# Patient Record
Sex: Female | Born: 1950 | Race: White | Hispanic: No | Smoking: Former smoker
Health system: Southern US, Community
[De-identification: ages and names within clinical notes are randomized; demographics above are authoritative.]

## PROBLEM LIST (undated history)

## (undated) DIAGNOSIS — E039 Hypothyroidism, unspecified: Secondary | ICD-10-CM

## (undated) DIAGNOSIS — K269 Duodenal ulcer, unspecified as acute or chronic, without hemorrhage or perforation: Secondary | ICD-10-CM

## (undated) DIAGNOSIS — F419 Anxiety disorder, unspecified: Secondary | ICD-10-CM

## (undated) DIAGNOSIS — E669 Obesity, unspecified: Secondary | ICD-10-CM

## (undated) DIAGNOSIS — K602 Anal fissure, unspecified: Secondary | ICD-10-CM

## (undated) DIAGNOSIS — K589 Irritable bowel syndrome without diarrhea: Secondary | ICD-10-CM

## (undated) DIAGNOSIS — I10 Essential (primary) hypertension: Secondary | ICD-10-CM

## (undated) DIAGNOSIS — C50919 Malignant neoplasm of unspecified site of unspecified female breast: Secondary | ICD-10-CM

## (undated) HISTORY — DX: Obesity, unspecified: E66.9

## (undated) HISTORY — PX: BLADDER SURGERY: SHX569

## (undated) HISTORY — PX: MASTECTOMY: SHX3

## (undated) HISTORY — DX: Anxiety disorder, unspecified: F41.9

## (undated) HISTORY — PX: CARPAL TUNNEL RELEASE: SHX101

---

## 1999-12-04 ENCOUNTER — Emergency Department (HOSPITAL_COMMUNITY): Admission: EM | Admit: 1999-12-04 | Discharge: 1999-12-04 | Payer: Self-pay | Admitting: Emergency Medicine

## 1999-12-04 ENCOUNTER — Encounter: Payer: Self-pay | Admitting: Emergency Medicine

## 2001-08-14 ENCOUNTER — Encounter: Payer: Self-pay | Admitting: Emergency Medicine

## 2001-08-14 ENCOUNTER — Emergency Department (HOSPITAL_COMMUNITY): Admission: EM | Admit: 2001-08-14 | Discharge: 2001-08-14 | Payer: Self-pay | Admitting: Emergency Medicine

## 2002-10-19 ENCOUNTER — Encounter: Admission: RE | Admit: 2002-10-19 | Discharge: 2002-10-19 | Payer: Self-pay | Admitting: Internal Medicine

## 2002-10-19 ENCOUNTER — Encounter: Payer: Self-pay | Admitting: Internal Medicine

## 2003-02-15 ENCOUNTER — Other Ambulatory Visit: Admission: RE | Admit: 2003-02-15 | Discharge: 2003-02-15 | Payer: Self-pay | Admitting: Obstetrics and Gynecology

## 2005-04-26 ENCOUNTER — Encounter: Admission: RE | Admit: 2005-04-26 | Discharge: 2005-04-26 | Payer: Self-pay

## 2006-03-28 ENCOUNTER — Other Ambulatory Visit: Admission: RE | Admit: 2006-03-28 | Discharge: 2006-03-28 | Payer: Self-pay | Admitting: Obstetrics and Gynecology

## 2006-03-30 ENCOUNTER — Emergency Department (HOSPITAL_COMMUNITY): Admission: EM | Admit: 2006-03-30 | Discharge: 2006-03-30 | Payer: Self-pay | Admitting: Emergency Medicine

## 2007-04-03 ENCOUNTER — Emergency Department (HOSPITAL_COMMUNITY): Admission: EM | Admit: 2007-04-03 | Discharge: 2007-04-03 | Payer: Self-pay | Admitting: Emergency Medicine

## 2007-08-28 ENCOUNTER — Emergency Department (HOSPITAL_COMMUNITY): Admission: EM | Admit: 2007-08-28 | Discharge: 2007-08-28 | Payer: Self-pay | Admitting: Emergency Medicine

## 2010-02-20 LAB — HM COLONOSCOPY

## 2011-05-09 LAB — VALPROIC ACID LEVEL: Valproic Acid Lvl: 55.3

## 2012-02-08 ENCOUNTER — Emergency Department (HOSPITAL_COMMUNITY): Payer: 59

## 2012-02-08 ENCOUNTER — Observation Stay (HOSPITAL_COMMUNITY)
Admission: EM | Admit: 2012-02-08 | Discharge: 2012-02-11 | DRG: 313 | Disposition: A | Discharge status: Home / Self Care | Payer: 59 | Attending: Internal Medicine | Admitting: Internal Medicine

## 2012-02-08 ENCOUNTER — Encounter (HOSPITAL_COMMUNITY): Payer: Self-pay | Admitting: Emergency Medicine

## 2012-02-08 DIAGNOSIS — R079 Chest pain, unspecified: Secondary | ICD-10-CM

## 2012-02-08 DIAGNOSIS — K589 Irritable bowel syndrome without diarrhea: Secondary | ICD-10-CM | POA: Diagnosis present

## 2012-02-08 DIAGNOSIS — Z8711 Personal history of peptic ulcer disease: Secondary | ICD-10-CM

## 2012-02-08 DIAGNOSIS — Z882 Allergy status to sulfonamides status: Secondary | ICD-10-CM

## 2012-02-08 DIAGNOSIS — R0789 Other chest pain: Principal | ICD-10-CM | POA: Diagnosis present

## 2012-02-08 DIAGNOSIS — K219 Gastro-esophageal reflux disease without esophagitis: Secondary | ICD-10-CM | POA: Diagnosis present

## 2012-02-08 DIAGNOSIS — Z885 Allergy status to narcotic agent status: Secondary | ICD-10-CM

## 2012-02-08 DIAGNOSIS — Z8249 Family history of ischemic heart disease and other diseases of the circulatory system: Secondary | ICD-10-CM

## 2012-02-08 DIAGNOSIS — R1013 Epigastric pain: Secondary | ICD-10-CM

## 2012-02-08 DIAGNOSIS — Z9010 Acquired absence of unspecified breast and nipple: Secondary | ICD-10-CM

## 2012-02-08 DIAGNOSIS — R0602 Shortness of breath: Secondary | ICD-10-CM

## 2012-02-08 DIAGNOSIS — Z87891 Personal history of nicotine dependence: Secondary | ICD-10-CM

## 2012-02-08 DIAGNOSIS — Z853 Personal history of malignant neoplasm of breast: Secondary | ICD-10-CM

## 2012-02-08 DIAGNOSIS — E039 Hypothyroidism, unspecified: Secondary | ICD-10-CM

## 2012-02-08 DIAGNOSIS — K279 Peptic ulcer, site unspecified, unspecified as acute or chronic, without hemorrhage or perforation: Secondary | ICD-10-CM | POA: Diagnosis present

## 2012-02-08 DIAGNOSIS — I10 Essential (primary) hypertension: Secondary | ICD-10-CM

## 2012-02-08 HISTORY — DX: Duodenal ulcer, unspecified as acute or chronic, without hemorrhage or perforation: K26.9

## 2012-02-08 HISTORY — DX: Irritable bowel syndrome, unspecified: K58.9

## 2012-02-08 HISTORY — DX: Essential (primary) hypertension: I10

## 2012-02-08 HISTORY — DX: Hypothyroidism, unspecified: E03.9

## 2012-02-08 HISTORY — DX: Anal fissure, unspecified: K60.2

## 2012-02-08 HISTORY — DX: Malignant neoplasm of unspecified site of unspecified female breast: C50.919

## 2012-02-08 LAB — CARDIAC PANEL(CRET KIN+CKTOT+MB+TROPI)
Relative Index: INVALID (ref 0.0–2.5)
Troponin I: <0.3 ng/mL (ref <0.30)

## 2012-02-08 LAB — URINALYSIS, ROUTINE W REFLEX MICROSCOPIC
Bilirubin Urine: NEGATIVE
Glucose, UA: NEGATIVE mg/dL
Hgb urine dipstick: NEGATIVE
Ketones, ur: NEGATIVE mg/dL
Leukocytes, UA: NEGATIVE
Nitrite: NEGATIVE
Protein, ur: NEGATIVE mg/dL
Specific Gravity, Urine: 1.019 (ref 1.005–1.030)
Urobilinogen, UA: 0.2 mg/dL (ref 0.0–1.0)
pH: 5 (ref 5.0–8.0)

## 2012-02-08 LAB — COMPREHENSIVE METABOLIC PANEL
ALT: 18 U/L (ref 0–35)
AST: 16 U/L (ref 0–37)
Albumin: 3.5 g/dL (ref 3.5–5.2)
Alkaline Phosphatase: 82 U/L (ref 39–117)
BUN: 12 mg/dL (ref 6–23)
CO2: 23 mEq/L (ref 19–32)
Calcium: 9.4 mg/dL (ref 8.4–10.5)
Chloride: 101 mEq/L (ref 96–112)
Creatinine, Ser: 0.64 mg/dL (ref 0.50–1.10)
GFR calc Af Amer: >90 mL/min (ref >90)
GFR calc non Af Amer: >90 mL/min (ref >90)
Glucose, Bld: 90 mg/dL (ref 70–99)
Potassium: 4.3 mEq/L (ref 3.5–5.1)
Sodium: 137 mEq/L (ref 135–145)
Total Bilirubin: 0.4 mg/dL (ref 0.3–1.2)
Total Protein: 7.6 g/dL (ref 6.0–8.3)

## 2012-02-08 LAB — CBC
HCT: 38.1 % (ref 36.0–46.0)
Hemoglobin: 12.8 g/dL (ref 12.0–15.0)
MCH: 29.6 pg (ref 26.0–34.0)
MCHC: 33.6 g/dL (ref 30.0–36.0)
MCV: 88.2 fL (ref 78.0–100.0)
Platelets: 274 10*3/uL (ref 150–400)
RBC: 4.32 MIL/uL (ref 3.87–5.11)
RDW: 13.7 % (ref 11.5–15.5)
WBC: 6.8 10*3/uL (ref 4.0–10.5)

## 2012-02-08 LAB — LIPID PANEL
Cholesterol: 181 mg/dL (ref 0–200)
HDL: 57 mg/dL (ref >39)
Total CHOL/HDL Ratio: 3.2 RATIO

## 2012-02-08 LAB — TROPONIN I: Troponin I: <0.3 ng/mL (ref <0.30)

## 2012-02-08 LAB — DIFFERENTIAL
Basophils Absolute: 0 10*3/uL (ref 0.0–0.1)
Basophils Relative: 0 % (ref 0–1)
Eosinophils Absolute: 0 10*3/uL (ref 0.0–0.7)
Eosinophils Relative: 1 % (ref 0–5)
Lymphocytes Relative: 24 % (ref 12–46)
Lymphs Abs: 1.6 10*3/uL (ref 0.7–4.0)
Monocytes Absolute: 0.4 10*3/uL (ref 0.1–1.0)
Monocytes Relative: 5 % (ref 3–12)
Neutro Abs: 4.8 10*3/uL (ref 1.7–7.7)
Neutrophils Relative %: 70 % (ref 43–77)

## 2012-02-08 LAB — LIPASE, BLOOD: Lipase: 25 U/L (ref 11–59)

## 2012-02-08 MED ORDER — LEVOTHYROXINE SODIUM 100 MCG PO TABS
100.0000 ug | ORAL_TABLET | Freq: Every day | ORAL | Status: DC
  Administered 2012-02-08 – 2012-02-11 (×4): 100 ug via ORAL
  Filled 2012-02-08 (×6): qty 1

## 2012-02-08 MED ORDER — ACETAMINOPHEN 650 MG RE SUPP: 650.0000 mg | Freq: Four times a day (QID) | RECTAL | Status: DC | PRN

## 2012-02-08 MED ORDER — ONDANSETRON HCL 4 MG/2ML IJ SOLN: 4.0000 mg | Freq: Four times a day (QID) | INTRAMUSCULAR | Status: DC | PRN

## 2012-02-08 MED ORDER — NITROGLYCERIN 0.4 MG SL SUBL: 0.4000 mg | SUBLINGUAL_TABLET | SUBLINGUAL | Status: DC | PRN

## 2012-02-08 MED ORDER — SODIUM CHLORIDE 0.9 % IV SOLN
INTRAVENOUS | Status: DC
  Administered 2012-02-08: 19:00:00 via INTRAVENOUS

## 2012-02-08 MED ORDER — ACETAMINOPHEN 325 MG PO TABS: 650.0000 mg | ORAL_TABLET | Freq: Four times a day (QID) | ORAL | Status: DC | PRN

## 2012-02-08 MED ORDER — ASPIRIN 81 MG PO CHEW
324.0000 mg | CHEWABLE_TABLET | Freq: Once | ORAL | Status: AC
  Administered 2012-02-08: 324 mg via ORAL
  Filled 2012-02-08: qty 4

## 2012-02-08 MED ORDER — ENOXAPARIN SODIUM 40 MG/0.4ML ~~LOC~~ SOLN
40.0000 mg | SUBCUTANEOUS | Status: DC
  Administered 2012-02-08 – 2012-02-10 (×3): 40 mg via SUBCUTANEOUS
  Filled 2012-02-08 (×4): qty 0.4

## 2012-02-08 MED ORDER — MORPHINE SULFATE 2 MG/ML IJ SOLN: 1.0000 mg | INTRAMUSCULAR | Status: DC | PRN

## 2012-02-08 MED ORDER — ALUM & MAG HYDROXIDE-SIMETH 200-200-20 MG/5ML PO SUSP: 30.0000 mL | Freq: Four times a day (QID) | ORAL | Status: DC | PRN

## 2012-02-08 MED ORDER — METOPROLOL SUCCINATE ER 50 MG PO TB24
50.0000 mg | ORAL_TABLET | Freq: Every day | ORAL | Status: DC
  Administered 2012-02-08 – 2012-02-11 (×4): 50 mg via ORAL
  Filled 2012-02-08 (×4): qty 1

## 2012-02-08 MED ORDER — BENAZEPRIL HCL 20 MG PO TABS
30.0000 mg | ORAL_TABLET | Freq: Every day | ORAL | Status: DC
  Administered 2012-02-08 – 2012-02-11 (×4): 30 mg via ORAL
  Filled 2012-02-08 (×4): qty 1

## 2012-02-08 MED ORDER — ONDANSETRON HCL 4 MG PO TABS: 4.0000 mg | ORAL_TABLET | Freq: Four times a day (QID) | ORAL | Status: DC | PRN

## 2012-02-08 MED ORDER — ATORVASTATIN CALCIUM 80 MG PO TABS
80.0000 mg | ORAL_TABLET | Freq: Every day | ORAL | Status: DC
  Administered 2012-02-08: 80 mg via ORAL
  Filled 2012-02-08 (×2): qty 1

## 2012-02-08 MED ORDER — GI COCKTAIL ~~LOC~~
30.0000 mL | Freq: Once | ORAL | Status: DC
  Filled 2012-02-08: qty 30

## 2012-02-08 MED ORDER — SODIUM CHLORIDE 0.9 % IJ SOLN
3.0000 mL | Freq: Two times a day (BID) | INTRAMUSCULAR | Status: DC
  Administered 2012-02-09 – 2012-02-10 (×2): 3 mL via INTRAVENOUS

## 2012-02-08 NOTE — ED Notes (Signed)
Patient transported to X-ray 

## 2012-02-08 NOTE — ED Notes (Signed)
Admitting at bedside 

## 2012-02-08 NOTE — ED Provider Notes (Signed)
History     CSN: [id number]  Arrival date & time 02/08/12  [id number]   First MD Initiated Contact with Patient 02/08/12 1029      Chief Complaint  Patient presents with  . Chest Pain    shortness of breath    (Consider location/radiation/quality/duration/timing/severity/associated sxs/prior treatment) HPI Patient resents emergency department with chest pain that has been intermittent over the past week.  Patient, states she has also had shortness of breath at rest and with exertion.  Patient, states she also had some upper abdominal pain, associated with the chest pain.  Patient denies nausea, vomiting, diarrhea, fever, weakness, numbness, dizziness, syncope, visual changes, or cough.  Patient, states she did not try any medications prior to arrival.  She was sent here by an urgent care center.  She normally seeks her medical care from them.  Past Medical History  Diagnosis Date  . Hypertension   . Hypothyroid     History reviewed. No pertinent past surgical history.  No family history on file.  History  Substance Use Topics  . Smoking status: Former Games developer  . Smokeless tobacco: Not on file  . Alcohol Use: 0.6 oz/week    1 Shots of liquor per week    OB History    Grav Para Term Preterm Abortions TAB SAB Ect Mult Living                  Review of Systems All other systems negative except as documented in the HPI. All pertinent positives and negatives as reviewed in the HPI.  Allergies  Codeine and Sulfa antibiotics  Home Medications   Current Outpatient Rx  Name Route Sig Dispense Refill  . BENAZEPRIL HCL 10 MG PO TABS Oral Take 10 mg by mouth daily. Take along with 20mg  tablet to equal 30mg  dose.    Marland Kitchen BENAZEPRIL HCL 20 MG PO TABS Oral Take 20 mg by mouth daily. Take along with 10mg  tablet to equal 30mg  dose.    . CO Q 10 PO Oral Take 2 tablets by mouth daily.    Marland Kitchen LEVOTHYROXINE SODIUM 100 MCG PO TABS Oral Take 100 mcg by mouth daily.    Marland Kitchen METOPROLOL SUCCINATE ER 50  MG PO TB24 Oral Take 50 mg by mouth daily. Take with or immediately following a meal.      BP 107/59  Pulse 72  Temp 98.1 F (36.7 C)  Resp 14  Ht 5' 1.5" (1.562 m)  Wt 182 lb (82.555 kg)  BMI 33.83 kg/m2  SpO2 97%  Physical Exam  Constitutional: She is oriented to person, place, and time. She appears well-developed and well-nourished. No distress.  HENT:  Head: Normocephalic and atraumatic.  Mouth/Throat: Oropharynx is clear and moist.  Eyes: Pupils are equal, round, and reactive to light.  Neck: Normal range of motion. Neck supple. No thyromegaly present.  Cardiovascular: Normal rate and regular rhythm.  Exam reveals no gallop and no friction rub.   No murmur heard. Pulmonary/Chest: Effort normal and breath sounds normal. No respiratory distress.  Abdominal: Soft. Bowel sounds are normal. There is tenderness. There is no rebound and no guarding.  Neurological: She is alert and oriented to person, place, and time.  Skin: Skin is warm and dry. No rash noted.    ED Course  Procedures (including critical care time)   Labs Reviewed  CBC  DIFFERENTIAL  COMPREHENSIVE METABOLIC PANEL  LIPASE, BLOOD  TROPONIN I  URINALYSIS, ROUTINE W REFLEX MICROSCOPIC   Dg Chest 2  View  02/08/2012  *RADIOLOGY REPORT*  Clinical Data: Chest pain.  Shortness of breath.  CHEST - 2 VIEW  Comparison: 04/03/2007 thoracic spine radiographs.  Findings: The cardiomediastinal silhouette is unremarkable. Mild peribronchial thickening is stable. Left mastectomy and axillary surgical changes noted. There is no evidence of focal airspace disease, pulmonary edema, suspicious pulmonary nodule/mass, pleural effusion, or pneumothorax. No acute bony abnormalities are identified.  IMPRESSION: No evidence of acute cardiopulmonary disease.  Original Report Authenticated By: Rosendo Gros, M.D.     1. Chest pain    The patient will be admitted to the family practice residents.  They will be down to see the patient  for evaluation of her chest pain.  Patient is advised of the results and the plan.  All questions were answered.   MDM  MDM Reviewed: nursing note and vitals Interpretation: labs, ECG and x-ray Consults: admitting MD            Carlyle Dolly, PA-C 02/08/12 1513

## 2012-02-08 NOTE — ED Provider Notes (Signed)
10:48 AM  Date: 02/08/2012  Rate: 76  Rhythm: normal sinus rhythm  QRS Axis: normal  Intervals: normal  ST/T Wave abnormalities: normal  Conduction Disutrbances:none  Narrative Interpretation: Normal EKG.  Old EKG Reviewed: none available    Carleene Cooper III, MD 02/08/12 1048

## 2012-02-08 NOTE — ED Notes (Signed)
Pt. Stated, I've had some sharp pain in the lower central area of my chest that radiated to my back  Onset for the last couple of days.

## 2012-02-08 NOTE — H&P (Signed)
Hospital Admission Note Date: 02/08/2012  Patient name: Kelsey Moore Medical record number: [id number] Date of birth: [birth year] Age: 61 y.o. Gender: female PCP: Charolett Bumpers, MD ([facility])  Medical Service: Internal Medicine Teaching Service   Attending physician:  Margarito Liner MD    1st Contact: Dr Manson Passey    Pager: [phone] 2nd Contact: Dr Loistine Chance    Pager: [phone]  After 5 pm or weekends: 1st Contact:      Pager: [phone] 2nd Contact:      Pager: [phone]  Chief Complaint: Chest pain  History of Present Illness: The patient is a 61 year old woman with PMH significant for Breast Cancer, Hypertension, prior PUD, and Hypothyroidism who presented to the Urgent Care with chest pain, sent to the ED for further evaluation and management.  The patient notes a 3-day history of chest pain, described as a mid-chest dull aching pain, 5/10 in severity, occurring several of times per day, lasting <5 minutes per episode, resolving without intervention, sometimes occurring at rest and sometimes with exertion.  The pain does note radiate, but the patient also notes an occasional left arm and left jaw feeling of "tightness", which also comes and goes, but not at the same time as the chest pain.  Chest pain is associated with mild SOB and nausea, but no vomiting.  She has a FH of a father with 2 MI's in his 19's, a prior history of smoking 24 pack-years, and HTN, but no known HL or DM.  Of note, the patient notes a stress test 2-3 years ago in [place] which she reports was normal.  The patient also notes a 2-week history of SOB, which comes and goes, which can occur both at rest or with exertion.  The SOB does not necessarily always correlate with her chest pain.  She notes a >1 year history of 3-pillow orthopnea, but no PND, no leg swelling, no history of CHF.  No fevers, chills, cough, ST, congestion.  The patient also notes a 2-3 day history of a constant sharp epigastric pain, 6/10 in  severity, radiating to her back.  The pain is unchanged by eating food, but appears to worsen 30 minutes after eating.  She notes no nausea or vomiting.  She notes chronic IBS, but recent stools have been soft (no diarrhea/constipation).  She notes occasional blood in her stools, present for many months, which she attributes to her known rectal fissures.  Meds: Benazepril 20 mg daily Toprol-XL 24-hr tab, 50 mg Synthroid 100 mcg daily Coenzyme Q10, 2 tablets daily  Allergies: Allergies as of 02/08/2012 - Review Complete 02/08/2012  Allergen Reaction Noted  . Codeine Nausea Only 02/08/2012  . Sulfa antibiotics Rash 02/08/2012   Past Medical History  Diagnosis Date  . Hypertension   . Hypothyroid     multinodular goiter by [place] 2004  . Duodenal ulcer     At age 16s  . Breast cancer     s/p Left mastectomy 1998, s/p R radical mastectomy 1997, lymph nodes negative, in remission  . IBS (irritable bowel syndrome)   . Anal fissure    Past Surgical History  Procedure Date  . Mastectomy 1997, 1998    Left mastectomy 1998, Right radical mastectomy 1997  . Carpal tunnel release   . Bladder surgery     vaginal approach (not abdominal approach)   Family History  Problem Relation Age of Onset  . Heart disease Mother 2    MI   . Hypertension Mother   .  Hypertension Father   . Heart disease Father 2    MI  . Cancer Mother     Breast cancer     History   Social History  . Marital Status: Married    Spouse Name: N/A    Number of Children: 0  . Years of Education: [employer] Sch[employer]   Occupational History  . 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 Smoking status: Former Smoker -- 1.0 packs/day for 24 years    Quit date: 08/19/1990  . Smokeless tobacco: Not on file  . Alcohol Use: 0.6 oz/week    1 Shots of liquor per week  . Drug Use: No  . Sexually Active: Not on file   Other Topics Concern  . Not on file   Social History  Narrative   Works as a Research officer, political party.  Lots of stress in her job.  She took a few college courses but did not complete college.  Her husband died in 2008/01/25, and she now lives alone.  [employer].  Literate.     Review of Systems: Positive if bold.  Constitutional: fever, chills, diaphoresis, appetite change and fatigue.  HEENT:  photophobia, eye pain, redness, hearing loss, ear pain, congestion, sore throat, rhinorrhea, sneezing, mouth sores, trouble swallowing, neck pain, neck stiffness and tinnitus.   Respiratory: SOB, DOE, cough, chest tightness,  and wheezing.   Cardiovascular:  chest pain, palpitations and leg swelling.  Gastrointestinal:  nausea, vomiting, abdominal pain, diarrhea, constipation, blood in stool and abdominal distention.  Genitourinary: dysuria, urgency, frequency, hematuria, flank pain and difficulty urinating.  Musculoskeletal:  myalgias, back pain, joint swelling, arthralgias and gait problem.  Skin:  pallor, rash and wound.  Neurological:  dizziness, seizures, syncope, weakness, light-headedness, numbness and headaches.  Hematological:  adenopathy. Easy bruising, personal or family bleeding history  Psychiatric/Behavioral:  suicidal ideation, mood changes, confusion, nervousness, sleep disturbance and agitation    Physical Exam: Blood pressure 152/72, pulse 86, temperature 98.1 F (36.7 C), resp. rate 14, height 5' 1.5" (1.562 m), weight 182 lb (82.555 kg), SpO2 98.00%. General: alert, cooperative, and in no apparent distress HEENT: pupils equal round and reactive to light, vision grossly intact, oropharynx clear and non-erythematous  Neck: supple, no lymphadenopathy Lungs: clear to ascultation bilaterally, normal work of respiration, no wheezes, rales, ronchi Heart: regular rate and rhythm, no murmurs, gallops, or rubs Abdomen: soft, significantly tender to epigastric palpation, non-distended, +bs, no guarding or rebound tenderness,  negative murphy's sign Extremities: no cyanosis, clubbing, or edema.  No LE edema, hyperpigmentation, or tenderness Neurologic: alert & oriented X3, cranial nerves II-XII intact, strength grossly intact, sensation intact to light touch  Lab results: Basic Metabolic Panel:  Basename 02/08/12 1045  NA 137  K 4.3  CL 101  CO2 23  GLUCOSE 90  BUN 12  CREATININE 0.64  CALCIUM 9.4  MG --  PHOS --   Liver Function Tests:  Basename 02/08/12 1045  AST 16  ALT 18  ALKPHOS 82  BILITOT 0.4  PROT 7.6  ALBUMIN 3.5    Basename 02/08/12 1045  LIPASE 25  AMYLASE --    CBC:  Basename 02/08/12 1045  WBC 6.8  NEUTROABS 4.8  HGB 12.8  HCT 38.1  MCV 88.2  PLT 274   Cardiac Enzymes:  Basename 02/08/12 1045  CKTOTAL --  CKMB --  CKMBINDEX --  TROPONINI <0.30   Urinalysis:  Basename 02/08/12 1130  COLORURINE YELLOW  LABSPEC 1.019  PHURINE 5.0  GLUCOSEU NEGATIVE  HGBUR NEGATIVE  BILIRUBINUR NEGATIVE  KETONESUR NEGATIVE  PROTEINUR NEGATIVE  UROBILINOGEN 0.2  NITRITE NEGATIVE  LEUKOCYTESUR NEGATIVE    Imaging results:  Dg Chest 2 View  02/08/2012  *RADIOLOGY REPORT*  Clinical Data: Chest pain.  Shortness of breath.  CHEST - 2 VIEW  Comparison: 04/03/2007 thoracic spine radiographs.  Findings: The cardiomediastinal silhouette is unremarkable. Mild peribronchial thickening is stable. Left mastectomy and axillary surgical changes noted. There is no evidence of focal airspace disease, pulmonary edema, suspicious pulmonary nodule/mass, pleural effusion, or pneumothorax. No acute bony abnormalities are identified.  IMPRESSION: No evidence of acute cardiopulmonary disease.  Original Report Authenticated By: Rosendo Gros, M.D.    Other results: EKG: NSR, no ST abnormalities  Assessment & Plan by Problem: The patient is a 61 yo woman, history of HTN, prior smoking, FH of CAD, presenting with chest pain and epigastric pain.  # Chest Pain/SOB - The patient notes a 3-day  history of occasional, non-exertional, dull chest pain.  Given her age and risk factors, I'm concerned about ACS (most likely UA).  Other potential diagnoses include GI (PUD, cholecystitis) vs msk vs CHF exacerbation.  Unlikely PE given Wells Score of 0 and non-pleuritic nature of chest pain.  No EKG evidence to suggest pericarditis.  No CXR evidence to suggest PNA vs pneumothorax. -will try to obtain records of prior cardiac stress test -Cardiac enzymes x3, pro-BNP, Hb A1C -cardiology consult -continue metoprolol, benazepril -s/p aspirin -O2 by [place] -lipitor 80 -GI cocktail, Maalox prn, protonix -abd [place] -morphine prn, zofran prn  # Epigastric pain - See above, differential includes GERD vs PUD vs cholelithiasis vs cholecystitis.  Unlikely pancreatitis (lipase not elevated) vs PBC (no elevated LFT's) vs SBO (passing stool) vs UTI/pyelo (UA negative). -morphine for pain -abd [place] -GI cocktail, maalox prn, protonix  # Hypothyroidism - chronic, stable -continue synthroid  # Hypertension - chronic, stable -continue metoprolol, benazepril  # Prophy - lovenox  SignedJanalyn Harder 02/08/2012, 5:24 PM

## 2012-02-08 NOTE — ED Notes (Signed)
MD at bedside. 

## 2012-02-08 NOTE — ED Provider Notes (Signed)
Medical screening examination/treatment/procedure(s) were conducted as a shared visit with non-physician practitioner(s) and myself.  I personally evaluated the patient during the encounter 61 yo woman with intermittent chest pain for the past week, also some upper abdominal pain.  EKG non acute and enzymes negative, but story suggests CAD.  Advised admission.   Carleene Cooper III, MD 02/08/12 [phone]

## 2012-02-09 ENCOUNTER — Inpatient Hospital Stay (HOSPITAL_COMMUNITY): Payer: 59

## 2012-02-09 DIAGNOSIS — R079 Chest pain, unspecified: Secondary | ICD-10-CM

## 2012-02-09 DIAGNOSIS — R072 Precordial pain: Secondary | ICD-10-CM

## 2012-02-09 DIAGNOSIS — R0602 Shortness of breath: Secondary | ICD-10-CM | POA: Diagnosis present

## 2012-02-09 LAB — CBC
Hemoglobin: 10.7 g/dL — ABNORMAL LOW (ref 12.0–15.0)
MCH: 29.6 pg (ref 26.0–34.0)
MCHC: 33.1 g/dL (ref 30.0–36.0)
Platelets: 235 10*3/uL (ref 150–400)

## 2012-02-09 LAB — CARDIAC PANEL(CRET KIN+CKTOT+MB+TROPI)
CK, MB: 1.1 ng/mL (ref 0.3–4.0)
Total CK: 55 U/L (ref 7–177)
Troponin I: <0.3 ng/mL (ref <0.30)

## 2012-02-09 LAB — HEMOGLOBIN A1C: Mean Plasma Glucose: 120 mg/dL — ABNORMAL HIGH (ref <117)

## 2012-02-09 LAB — BASIC METABOLIC PANEL
BUN: 11 mg/dL (ref 6–23)
Calcium: 8.3 mg/dL — ABNORMAL LOW (ref 8.4–10.5)
GFR calc non Af Amer: >90 mL/min (ref >90)
Glucose, Bld: 94 mg/dL (ref 70–99)

## 2012-02-09 MED ORDER — ASPIRIN 325 MG PO TABS
325.0000 mg | ORAL_TABLET | Freq: Every day | ORAL | Status: DC
  Administered 2012-02-10 – 2012-02-11 (×2): 325 mg via ORAL
  Filled 2012-02-09 (×3): qty 1

## 2012-02-09 NOTE — Consult Note (Signed)
CARDIOLOGY CONSULT NOTE    Patient ID: Kelsey Moore MRN: [id number] DOB/AGE: 1951-02-05 61 y.o.  Admit date: 02/08/2012 Referring Physician:  Manson Passey Primary Physician: Charolett Bumpers, MD Primary Cardiologist:  New Reason for Consultation: Chest Pain  Principal Problem:  *Chest pain Active Problems:  Hypertension  Hypothyroidism  Epigastric pain   HPI:   The patient is a 61 year old woman with PMH significant for Breast Cancer, Hypertension, prior PUD, and Hypothyroidism who presented to the Urgent Care with chest pain yesterday, sent to the ED for further evaluation and management. The patient notes a 3-day history of chest pain, described as a mid-chest dull aching pain, 5/10 in severity, occurring several of times per day, lasting <5 minutes per episode, resolving without intervention, sometimes occurring at rest and sometimes with exertion. The pain does note radiate, but the patient also notes an occasional left arm and left jaw feeling of "tightness", which also comes and goes, but not at the same time as the chest pain. Chest pain is associated with mild SOB and nausea, but no vomiting. She has a FH of a father with 2 MI's in his 61's, a prior history of smoking 24 pack-years, and HTN, but no known HL or DM. Of note, the patient notes a stress test 2-3 years ago in [place] which she reports was normal.  The patient also notes a 2-week history of SOB, which comes and goes, which can occur both at rest or with exertion. The SOB does not necessarily always correlate with her chest pain. She notes a >1 year history of 3-pillow orthopnea, but no PND, no leg swelling, no history of CHF. No fevers, chills, cough, ST, congestion.  The patient also notes a 2-3 day history of a constant sharp epigastric pain, 6/10 in severity, radiating to her back. The pain is unchanged by eating food, but appears to worsen 30 minutes after eating. She notes no nausea or vomiting. She notes chronic IBS, but recent  stools have been soft (no diarrhea/constipation). She notes occasional blood in her stools, present for many months, which she attributes to her known rectal fissures.  Curently pain free.  He pain has multiple manifestations and are mostly atypical.  So far has R/O with no acute ECG chages.    @ROS @ All other systems reviewed and negative except as noted above  Past Medical History  Diagnosis Date  . Hypertension   . Hypothyroid     multinodular goiter by [place] 2004  . Duodenal ulcer     At age 4s  . Breast cancer     s/p Left mastectomy 1998, s/p R radical mastectomy 1997, lymph nodes negative, in remission  . IBS (irritable bowel syndrome)   . Anal fissure     Family History  Problem Relation Age of Onset  . Heart disease Mother 55    MI   . Hypertension Mother   . Hypertension Father   . Heart disease Father 13    MI  . Cancer Mother     Breast cancer     History   Social History  . Marital Status: Married    Spouse Name: N/A    Number of Children: 0  . Years of Education: [employer]   Occupational History  . 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 Smoking status: Former Smoker -- 1.0 packs/day for 24 years    Quit date: 08/19/1990  . Smokeless  tobacco: Not on file  . Alcohol Use: 0.6 oz/week    1 Shots of liquor per week  . Drug Use: No  . Sexually Active: Not on file   Other Topics Concern  . 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  She took a few college courses but did not complete college.  Her husband died in 01/21/2008, and she now lives alone.  [employer].  Literate.    Past Surgical History  Procedure Date  . Mastectomy 1997, 1998    Left mastectomy 1998, Right radical mastectomy 1997  . Carpal tunnel release   . Bladder surgery     vaginal approach (not abdominal approach)        . aspirin  324 mg Oral Once  .  atorvastatin  80 mg Oral q1800  . benazepril  30 mg Oral Daily  . enoxaparin  40 mg Subcutaneous Q24H  . gi cocktail  30 mL Oral Once  . levothyroxine  100 mcg Oral QAC breakfast  . metoprolol succinate  50 mg Oral Daily  . sodium chloride  3 mL Intravenous Q12H      . sodium chloride 75 mL/hr at 02/08/12 1838    Physical Exam: Blood pressure 123/70, pulse 78, temperature 98.5 F (36.9 C), temperature source Oral, resp. rate 18, height 5\' 1"  (1.549 m), weight 127.914 kg (282 lb), SpO2 98.00%.   Affect appropriate Obese talkative white female HEENT: normal Neck supple with no adenopathy JVP normal no bruits no thyromegaly Lungs clear with no wheezing and good diaphragmatic motion Heart:  S1/S2 no murmur, no rub, gallop or click PMI normal Abdomen: midl epigstric pain , BS positve, no tenderness, no AAA no bruit.  No HSM or HJR Distal pulses intact with no bruits No edema Neuro non-focal Skin warm and dry No muscular weakness   Labs:   Lab Results  Component Value Date   WBC 6.2 02/09/2012   HGB 10.7* 02/09/2012   HCT 32.3* 02/09/2012   MCV 89.5 02/09/2012   PLT 235 02/09/2012    Lab 02/09/12 0440 02/08/12 1045  NA 140 --  K 3.5 --  CL 109 --  CO2 22 --  BUN 11 --  CREATININE 0.56 --  CALCIUM 8.3* --  PROT -- 7.6  BILITOT -- 0.4  ALKPHOS -- 82  ALT -- 18  AST -- 16  GLUCOSE 94 --   Lab Results  Component Value Date   CKTOTAL 47 02/09/2012   CKMB 1.1 02/09/2012   TROPONINI <0.30 02/09/2012    Lab Results  Component Value Date   CHOL 181 02/08/2012   Lab Results  Component Value Date   HDL 57 02/08/2012   Lab Results  Component Value Date   LDLCALC 98 02/08/2012   Lab Results  Component Value Date   TRIG 131 02/08/2012   Lab Results  Component Value Date   CHOLHDL 3.2 02/08/2012   No results found for this basename: LDLDIRECT      Radiology: Dg Chest 2 View  02/08/2012  *RADIOLOGY REPORT*  Clinical Data: Chest pain.  Shortness of breath.  CHEST  - 2 VIEW  Comparison: 04/03/2007 thoracic spine radiographs.  Findings: The cardiomediastinal silhouette is unremarkable. Mild peribronchial thickening is stable. Left mastectomy and axillary surgical changes noted. There is no evidence of focal airspace disease, pulmonary edema, suspicious pulmonary nodule/mass, pleural effusion, or pneumothorax. No acute  bony abnormalities are identified.  IMPRESSION: No evidence of acute cardiopulmonary disease.  Original Report Authenticated By: Rosendo Gros, M.D.   US Abdomen Complete  02/09/2012  *RADIOLOGY REPORT*  Clinical Data:  61 year old female with abdominal pain.  ABDOMINAL ULTRASOUND COMPLETE  Comparison:  10/15/2007 renal ultrasound.  Findings:  Gallbladder:  The gallbladder is unremarkable. There is no evidence of gallstones, gallbladder wall thickening, or pericholecystic fluid.  Common Bile Duct:  There is no evidence of intrahepatic or extrahepatic biliary dilation. The CBD measures 4.0 mm in greatest diameter.  Liver:  Diffusely increased echogenicity of the liver is compatible with fatty infiltration.No focal abnormalities are identified.  IVC:  Appears normal.  Pancreas:  Although the pancreas is difficult to visualize in its entirety, no focal pancreatic abnormality is identified.  Spleen:  Within normal limits in size and echotexture.  Right kidney:  The right kidney is normal in size and parenchymal echogenicity.  There is no evidence of solid mass, hydronephrosis or definite renal calculi.  The right kidney measures 11.0 cm.  Left kidney:  The left kidney is normal in size and parenchymal echogenicity.  There is no evidence of solid mass, hydronephrosis or definite renal calculi.   The left kidney measures 11.0 cm.  Abdominal Aorta:  No abdominal aortic aneurysm identified.  There is no evidence of ascites.  IMPRESSION: Fatty infiltration of the liver, otherwise unremarkable abdominal ultrasound.  Original Report Authenticated By: Rosendo Gros, M.D.      EKG: 6/22 NSR rate 76 normal ECG   ASSESSMENT AND PLAN:  Chest Pain:  Mostly atypical pain.  Given female gender and body habitus favor stress myovue for risk stratification.  Discussed with patient and she is willing to proceed Can walk on treadmill well Chol:   Cholesterol is at goal.  Continue current dose of statin and diet Rx.  No myalgias or side effects.  F/U  LFT's in 6 months. Lab Results  Component Value Date   LDLCALC 98 02/08/2012   HTN: Well controlled.  Continue current medications and low sodium Dash type diet.   Dyspnea:  Echo pending CXR normal consider OSA w/u as outpatient Anemia: epigastric pain with anemia.  [place] normal guaic stools consider iron studies per primary service   Signed: Charlton Haws 02/09/2012, 10:46 AM

## 2012-02-09 NOTE — Progress Notes (Signed)
Subjective: No acute events overnight.  The patient continues to note occasional chest pain overnight.  Epigastric pain has improved somewhat.  Abd US showed no acute pathology.  Awaiting echocardiogram.  Given continued chest pain, will consult cardiology.  Objective: Vital signs in last 24 hours: Filed Vitals:   02/08/12 1721 02/08/12 2100 02/09/12 0500 02/09/12 1011  BP: 150/80 118/72 110/73 123/70  Pulse: 84 78 71 78  Temp: 98.4 F (36.9 C) 97.9 F (36.6 C) 98.5 F (36.9 C)   TempSrc: Oral     Resp: 20 20 18    Height: 5\' 1"  (1.549 m)     Weight: 282 lb (127.914 kg)     SpO2: 97% 96% 98%    Weight change:  No intake or output data in the 24 hours ending 02/09/12 1042  PEX General: alert, cooperative, and in no apparent distress HEENT: pupils equal round and reactive to light, vision grossly intact, oropharynx clear and non-erythematous  Neck: supple, no lymphadenopathy Lungs: clear to ascultation bilaterally, normal work of respiration, no wheezes, rales, ronchi Heart: regular rate and rhythm, no murmurs, gallops, or rubs Abdomen: soft, tender to epigastric palpation, non-distended, +bs, no guarding or rebound tenderness Extremities: no cyanosis, clubbing, or edema Neurologic: alert & oriented X3, cranial nerves II-XII intact, strength grossly intact, sensation intact to light touch  Lab Results: Basic Metabolic Panel:  Lab 02/09/12 1610 02/08/12 1629 02/08/12 1045  NA 140 -- 137  K 3.5 -- 4.3  CL 109 -- 101  CO2 22 -- 23  GLUCOSE 94 -- 90  BUN 11 -- 12  CREATININE 0.56 -- 0.64  CALCIUM 8.3* -- 9.4  MG -- 2.2 --  PHOS -- -- --   Liver Function Tests:  Lab 02/08/12 1045  AST 16  ALT 18  ALKPHOS 82  BILITOT 0.4  PROT 7.6  ALBUMIN 3.5    Lab 02/08/12 1045  LIPASE 25  AMYLASE --   CBC:  Lab 02/09/12 0440 02/08/12 1045  WBC 6.2 6.8  NEUTROABS -- 4.8  HGB 10.7* 12.8  HCT 32.3* 38.1  MCV 89.5 88.2  PLT 235 274   Cardiac Enzymes:  Lab 02/09/12 0440  02/08/12 2250 02/08/12 1631  CKTOTAL 47 55 50  CKMB 1.1 1.1 1.3  CKMBINDEX -- -- --  TROPONINI <0.30 <0.30 <0.30   BNP:  Lab 02/08/12 1631  PROBNP 47.4   Hemoglobin A1C:  Lab 02/08/12 1629  HGBA1C 5.8*   Fasting Lipid Panel:  Lab 02/08/12 1636  CHOL 181  HDL 57  LDLCALC 98  TRIG 131  CHOLHDL 3.2  LDLDIRECT --   Thyroid Function Tests:  Lab 02/08/12 1629  TSH 0.426  T4TOTAL --  FREET4 --  T3FREE --  THYROIDAB --   Urinalysis:  Lab 02/08/12 1130  COLORURINE YELLOW  LABSPEC 1.019  PHURINE 5.0  GLUCOSEU NEGATIVE  HGBUR NEGATIVE  BILIRUBINUR NEGATIVE  KETONESUR NEGATIVE  PROTEINUR NEGATIVE  UROBILINOGEN 0.2  NITRITE NEGATIVE  LEUKOCYTESUR NEGATIVE    Studies/Results: Dg Chest 2 View  02/08/2012  *RADIOLOGY REPORT*  Clinical Data: Chest pain.  Shortness of breath.  CHEST - 2 VIEW  Comparison: 04/03/2007 thoracic spine radiographs.  Findings: The cardiomediastinal silhouette is unremarkable. Mild peribronchial thickening is stable. Left mastectomy and axillary surgical changes noted. There is no evidence of focal airspace disease, pulmonary edema, suspicious pulmonary nodule/mass, pleural effusion, or pneumothorax. No acute bony abnormalities are identified.  IMPRESSION: No evidence of acute cardiopulmonary disease.  Original Report Authenticated By: JEFFREY T. HU,  M.D.   US Abdomen Complete  02/09/2012  *RADIOLOGY REPORT*  Clinical Data:  61 year old female with abdominal pain.  ABDOMINAL ULTRASOUND COMPLETE  Comparison:  10/15/2007 renal ultrasound.  Findings:  Gallbladder:  The gallbladder is unremarkable. There is no evidence of gallstones, gallbladder wall thickening, or pericholecystic fluid.  Common Bile Duct:  There is no evidence of intrahepatic or extrahepatic biliary dilation. The CBD measures 4.0 mm in greatest diameter.  Liver:  Diffusely increased echogenicity of the liver is compatible with fatty infiltration.No focal abnormalities are identified.  IVC:   Appears normal.  Pancreas:  Although the pancreas is difficult to visualize in its entirety, no focal pancreatic abnormality is identified.  Spleen:  Within normal limits in size and echotexture.  Right kidney:  The right kidney is normal in size and parenchymal echogenicity.  There is no evidence of solid mass, hydronephrosis or definite renal calculi.  The right kidney measures 11.0 cm.  Left kidney:  The left kidney is normal in size and parenchymal echogenicity.  There is no evidence of solid mass, hydronephrosis or definite renal calculi.   The left kidney measures 11.0 cm.  Abdominal Aorta:  No abdominal aortic aneurysm identified.  There is no evidence of ascites.  IMPRESSION: Fatty infiltration of the liver, otherwise unremarkable abdominal ultrasound.  Original Report Authenticated By: Rosendo Gros, M.D.   Medications: I have reviewed the patient's current medications. Scheduled Meds:   . aspirin  324 mg Oral Once  . atorvastatin  80 mg Oral q1800  . benazepril  30 mg Oral Daily  . enoxaparin  40 mg Subcutaneous Q24H  . gi cocktail  30 mL Oral Once  . levothyroxine  100 mcg Oral QAC breakfast  . metoprolol succinate  50 mg Oral Daily  . sodium chloride  3 mL Intravenous Q12H   Continuous Infusions:   . sodium chloride 75 mL/hr at 02/08/12 1838   PRN Meds:.acetaminophen, acetaminophen, alum & mag hydroxide-simeth, morphine injection, nitroGLYCERIN, ondansetron (ZOFRAN) IV, ondansetron  Assessment/Plan: The patient is a 61 yo woman, history of HTN, prior smoking, FH of CAD, presenting with chest pain and epigastric pain.   # Chest Pain/SOB - The patient's chest pain is atypical, but given risk factors of age, prior smoking, HTN, and FH, I remain concerned about UA.  Otherwise, most likely diagnosis is GI (GERD vs PUD) vs anxiety vs msk.  No lab/radiologic evidence of cholecystitis, PNA, pericarditis, pneumothorax, dissection. -We've requested records of prior cardiac stress test,  will place in shadow chart when received -cardiac enzymes neg x3, pro-BNP negative -consulted Dr. Eden Emms of Cardiology today, greatly appreciate assistance -patient may need inpt vs outpt stress test -continue metoprolol, benazepril, aspirin  -O2 by [place]  -patient declines lipitor out of fear of side effects.  Given patient preference, lipid profile, and negative CE x3, we can discontinue lipitor for now -GI cocktail, Maalox prn, protonix  -morphine prn, zofran prn   # Epigastric pain - Most likely GERD.  Also possibly PUD.  No evidence of cholecystitis, pancreatitis, PBC, SBO, UTI. -morphine for pain  -maalox prn, protonix   # Hypothyroidism - chronic, stable  -continue synthroid   # Hypertension - chronic, stable  -continue metoprolol, benazepril   # Prophy - lovenox    LOS: 1 day   Janalyn Harder 02/09/2012, 10:42 AM

## 2012-02-09 NOTE — Progress Notes (Signed)
  Echocardiogram 2D Echocardiogram has been performed.  [place] 02/09/2012, 5:51 PM

## 2012-02-09 NOTE — H&P (Signed)
44 woman admitted for recurrent spells of central chest pain, not consistently exertional.  EKG and troponins normal.  Cor tones normal.  Also has epigastric pain and mild tenderness.  Has had unusual stress at work lately.  Prior hx of ulcer, probably decades ago.  VS and all labs normal.  Has been seen by cardiology and stress test planned for tomorrow.  If unrevealing, may need EGD in future.

## 2012-02-10 ENCOUNTER — Inpatient Hospital Stay (HOSPITAL_COMMUNITY): Payer: 59

## 2012-02-10 DIAGNOSIS — R079 Chest pain, unspecified: Secondary | ICD-10-CM

## 2012-02-10 LAB — BASIC METABOLIC PANEL
BUN: 14 mg/dL (ref 6–23)
Chloride: 104 mEq/L (ref 96–112)
Creatinine, Ser: 0.75 mg/dL (ref 0.50–1.10)
GFR calc Af Amer: >90 mL/min (ref >90)
Glucose, Bld: 94 mg/dL (ref 70–99)

## 2012-02-10 LAB — CBC
HCT: 36.7 % (ref 36.0–46.0)
Hemoglobin: 11.8 g/dL — ABNORMAL LOW (ref 12.0–15.0)
MCV: 90.2 fL (ref 78.0–100.0)
RDW: 13.9 % (ref 11.5–15.5)
WBC: 6.7 10*3/uL (ref 4.0–10.5)

## 2012-02-10 NOTE — Progress Notes (Signed)
Internal Medicine Attending  Date: 02/10/2012  Patient name: Kelsey Moore Medical record number: [id number] Date of birth: 1951-03-05 Age: 61 y.o. Gender: female  I saw and evaluated the patient. I reviewed the resident's note by Dr. Manson Passey and I agree with the resident's findings and plans as documented in his note, with the following additional comments.  Patient reports that epigastric pain was her main problem when she came in to the hospital, and she also gives a history of chronic diarrhea attributed to irritable bowel syndrome in the past; she reports having had a colonoscopy about 2 years ago but reports having no EGD or upper GI workup.  She also reports a chronic problem with food (both solids and liquids) sometimes sticking and then taking a few seconds to move into her stomach.  Given this history, if her cardiac workup is negative, then empiric treatment with a PPI and an upper GI workup in the near future with EGD would be appropriate.

## 2012-02-10 NOTE — Progress Notes (Signed)
Subjective: Continued occasional chest/epigastric pain overnight, likely GI, unlikely cardiac in etiology.  Patient to go to cardiac stress test this morning.  If negative, likely discharge with outpatient ppi treatment.  Echo shows no wall motion abnormalities  Objective: Vital signs in last 24 hours: Filed Vitals:   02/10/12 0931 02/10/12 0933 02/10/12 0934 02/10/12 0936  BP: 242/62 172/62 175/63 154/64  Pulse: 93 88 88 83  Temp:      TempSrc:      Resp:      Height:      Weight:      SpO2:       Weight change:   Intake/Output Summary (Last 24 hours) at 02/10/12 1216 Last data filed at 02/09/12 1800  Gross per 24 hour  Intake    720 ml  Output      0 ml  Net    720 ml    PEX General: alert, cooperative, and in no apparent distress HEENT: pupils equal round and reactive to light, vision grossly intact, oropharynx clear and non-erythematous  Neck: supple, no lymphadenopathy Lungs: clear to ascultation bilaterally, normal work of respiration, no wheezes, rales, ronchi Heart: regular rate and rhythm, no murmurs, gallops, or rubs Abdomen: soft, tender to epigastric palpation, non-distended, +bs, no guarding or rebound tenderness Extremities: no cyanosis, clubbing, or edema Neurologic: alert & oriented X3, cranial nerves II-XII intact, strength grossly intact, sensation intact to light touch  Lab Results: Basic Metabolic Panel:  Lab 02/10/12 9147 02/09/12 0440 02/08/12 1629  NA 140 140 --  K 4.2 3.5 --  CL 104 109 --  CO2 27 22 --  GLUCOSE 94 94 --  BUN 14 11 --  CREATININE 0.75 0.56 --  CALCIUM 9.3 8.3* --  MG -- -- 2.2  PHOS -- -- --   Liver Function Tests:  Lab 02/08/12 1045  AST 16  ALT 18  ALKPHOS 82  BILITOT 0.4  PROT 7.6  ALBUMIN 3.5    Lab 02/08/12 1045  LIPASE 25  AMYLASE --   CBC:  Lab 02/10/12 0655 02/09/12 0440 02/08/12 1045  WBC 6.7 6.2 --  NEUTROABS -- -- 4.8  HGB 11.8* 10.7* --  HCT 36.7 32.3* --  MCV 90.2 89.5 --  PLT 258 235 --    Cardiac Enzymes:  Lab 02/09/12 0440 02/08/12 2250 02/08/12 1631  CKTOTAL 47 55 50  CKMB 1.1 1.1 1.3  CKMBINDEX -- -- --  TROPONINI <0.30 <0.30 <0.30   BNP:  Lab 02/08/12 1631  PROBNP 47.4   Hemoglobin A1C:  Lab 02/08/12 1629  HGBA1C 5.8*   Fasting Lipid Panel:  Lab 02/08/12 1636  CHOL 181  HDL 57  LDLCALC 98  TRIG 131  CHOLHDL 3.2  LDLDIRECT --   Thyroid Function Tests:  Lab 02/08/12 1629  TSH 0.426  T4TOTAL --  FREET4 --  T3FREE --  THYROIDAB --   Urinalysis:  Lab 02/08/12 1130  COLORURINE YELLOW  LABSPEC 1.019  PHURINE 5.0  GLUCOSEU NEGATIVE  HGBUR NEGATIVE  BILIRUBINUR NEGATIVE  KETONESUR NEGATIVE  PROTEINUR NEGATIVE  UROBILINOGEN 0.2  NITRITE NEGATIVE  LEUKOCYTESUR NEGATIVE    Studies/Results: US Abdomen Complete  02/09/2012  *RADIOLOGY REPORT*  Clinical Data:  61 year old female with abdominal pain.  ABDOMINAL ULTRASOUND COMPLETE  Comparison:  10/15/2007 renal ultrasound.  Findings:  Gallbladder:  The gallbladder is unremarkable. There is no evidence of gallstones, gallbladder wall thickening, or pericholecystic fluid.  Common Bile Duct:  There is no evidence of intrahepatic or extrahepatic biliary  dilation. The CBD measures 4.0 mm in greatest diameter.  Liver:  Diffusely increased echogenicity of the liver is compatible with fatty infiltration.No focal abnormalities are identified.  IVC:  Appears normal.  Pancreas:  Although the pancreas is difficult to visualize in its entirety, no focal pancreatic abnormality is identified.  Spleen:  Within normal limits in size and echotexture.  Right kidney:  The right kidney is normal in size and parenchymal echogenicity.  There is no evidence of solid mass, hydronephrosis or definite renal calculi.  The right kidney measures 11.0 cm.  Left kidney:  The left kidney is normal in size and parenchymal echogenicity.  There is no evidence of solid mass, hydronephrosis or definite renal calculi.   The left kidney  measures 11.0 cm.  Abdominal Aorta:  No abdominal aortic aneurysm identified.  There is no evidence of ascites.  IMPRESSION: Fatty infiltration of the liver, otherwise unremarkable abdominal ultrasound.  Original Report Authenticated By: Rosendo Gros, M.D.   Medications: I have reviewed the patient's current medications. Scheduled Meds:    . aspirin  325 mg Oral Daily  . benazepril  30 mg Oral Daily  . enoxaparin  40 mg Subcutaneous Q24H  . gi cocktail  30 mL Oral Once  . levothyroxine  100 mcg Oral QAC breakfast  . metoprolol succinate  50 mg Oral Daily  . sodium chloride  3 mL Intravenous Q12H   Continuous Infusions:  PRN Meds:.acetaminophen, acetaminophen, alum & mag hydroxide-simeth, morphine injection, nitroGLYCERIN, ondansetron (ZOFRAN) IV, ondansetron  Assessment/Plan: The patient is a 61 yo woman, history of HTN, prior smoking, FH of CAD, presenting with chest pain and epigastric pain.   # Chest Pain/SOB - The patient's chest pain is atypical, but given risk factors of age, prior smoking, HTN, and FH, concerned about UA.  Otherwise, most likely diagnosis is GI (GERD vs PUD) vs anxiety vs msk.  No lab/radiologic evidence of cholecystitis, PNA, pericarditis, pneumothorax, dissection. -cardiac enzymes neg x3, pro-BNP negative -consulted Dr. Eden Emms of Cardiology today, greatly appreciate assistance -outpt stress test today, results pending -echo shows no wall motion abnormalities -continue metoprolol, benazepril, aspirin  -O2 by [place]  -patient declines lipitor out of fear of side effects.  Given patient preference, lipid profile, and negative CE x3, we can discontinue lipitor for now -GI cocktail, Maalox prn, protonix  -morphine prn, zofran prn   # Epigastric pain - Most likely GERD.  Also possibly PUD.  No evidence of cholecystitis, pancreatitis, PBC, SBO, UTI. -morphine for pain  -maalox prn, protonix  -stool antigen for H Pylori, consider empiric treatment at discharge  #  Hypothyroidism - chronic, stable  -continue synthroid   # Hypertension - chronic, stable  -continue metoprolol, benazepril   # Prophy - lovenox    LOS: 2 days   Janalyn Harder 02/10/2012, 12:16 PM

## 2012-02-10 NOTE — Discharge Summary (Signed)
Internal Medicine Teaching [facility] Discharge Note  Name: Kelsey Moore MRN: [id number] DOB: [birth year] 61 y.o.  Date of Admission: 02/08/2012 10:16 AM Date of Discharge: 02/11/2012 Attending Physician: Farley Ly, MD  Discharge Diagnosis: 1. Chest Pain - non-cardiac, likely stress vs GI 2. Epigastric Pain - likely represents GERD vs PUD, recommend EGD 3. Hypothyroidism - stable 4. Hypertension - stable  Discharge Medications: Medication List  As of 02/12/2012  2:45 PM   TAKE these medications         benazepril 20 MG tablet   Commonly known as: LOTENSIN   Take 20 mg by mouth daily. Take along with 10mg  tablet to equal 30mg  dose.      benazepril 10 MG tablet   Commonly known as: LOTENSIN   Take 10 mg by mouth daily. Take along with 20mg  tablet to equal 30mg  dose.      CO Q 10 PO   Take 2 tablets by mouth daily.      levothyroxine 100 MCG tablet   Commonly known as: SYNTHROID, LEVOTHROID   Take 100 mcg by mouth daily.      metoprolol succinate 50 MG 24 hr tablet   Commonly known as: TOPROL-XL   Take 50 mg by mouth daily. Take with or immediately following a meal.      pantoprazole 40 MG tablet   Commonly known as: PROTONIX   Take 1 tablet (40 mg total) by mouth daily.      sucralfate 1 G tablet   Commonly known as: CARAFATE   Take 1 tablet (1 g total) by mouth 2 (two) times daily as needed.            Disposition and follow-up:   [name] was discharged from [facility] in stable and improved condition, with improvement in chest pain and epigastric pain.  The patient will follow-up with her PCP, Dr. Cleda Clarks, on 7/3.  The patient will also follow-up with [facility], with Dr. Rhea Belton, on 6/28, for an initial visit to determine the need for EGD (see discussion below).  Follow-up Appointments:  Follow-up Information    Follow up with Charolett Bumpers, MD on 02/19/2012. (2:00 pm.  FYI, Dr. Cleda Clarks is available from 11pm until close for  walk-in appt)    Contact information:   [address] [facility] [place] [address] [phone]       Follow up with Beverley Fiedler, MD on 02/14/2012. (9:30 pm)    Contact information:   [address] [place] [address] [phone]          Consultations: Cardiology [facility])  Procedures Performed:  Dg Chest 2 View  02/08/2012  *RADIOLOGY REPORT*  Clinical Data: Chest pain.  Shortness of breath.  CHEST - 2 VIEW  Comparison: 04/03/2007 thoracic spine radiographs.  Findings: The cardiomediastinal silhouette is unremarkable. Mild peribronchial thickening is stable. Left mastectomy and axillary surgical changes noted. There is no evidence of focal airspace disease, pulmonary edema, suspicious pulmonary nodule/mass, pleural effusion, or pneumothorax. No acute bony abnormalities are identified.  IMPRESSION: No evidence of acute cardiopulmonary disease.  Original Report Authenticated By: Rosendo Gros, M.D.   US Abdomen Complete  02/09/2012  *RADIOLOGY REPORT*  Clinical Data:  61 year old female with abdominal pain.  ABDOMINAL ULTRASOUND COMPLETE  Comparison:  10/15/2007 renal ultrasound.  Findings:  Gallbladder:  The gallbladder is unremarkable. There is no evidence of gallstones, gallbladder wall thickening, or pericholecystic fluid.  Common Bile Duct:  There is no  evidence of intrahepatic or extrahepatic biliary dilation. The CBD measures 4.0 mm in greatest diameter.  Liver:  Diffusely increased echogenicity of the liver is compatible with fatty infiltration.No focal abnormalities are identified.  IVC:  Appears normal.  Pancreas:  Although the pancreas is difficult to visualize in its entirety, no focal pancreatic abnormality is identified.  Spleen:  Within normal limits in size and echotexture.  Right kidney:  The right kidney is normal in size and parenchymal echogenicity.  There is no evidence of solid mass, hydronephrosis or definite renal calculi.  The right kidney  measures 11.0 cm.  Left kidney:  The left kidney is normal in size and parenchymal echogenicity.  There is no evidence of solid mass, hydronephrosis or definite renal calculi.   The left kidney measures 11.0 cm.  Abdominal Aorta:  No abdominal aortic aneurysm identified.  There is no evidence of ascites.  IMPRESSION: Fatty infiltration of the liver, otherwise unremarkable abdominal ultrasound.  Original Report Authenticated By: Rosendo Gros, M.D.    Admission HPI:  The patient is a 61 year old woman with PMH significant for Breast Cancer, Hypertension, prior PUD, and Hypothyroidism who presented to the Urgent Care with chest pain, sent to the ED for further evaluation and management. The patient notes a 3-day history of chest pain, described as a mid-chest dull aching pain, 5/10 in severity, occurring several of times per day, lasting <5 minutes per episode, resolving without intervention, sometimes occurring at rest and sometimes with exertion. The pain does note radiate, but the patient also notes an occasional left arm and left jaw feeling of "tightness", which also comes and goes, but not at the same time as the chest pain. Chest pain is associated with mild SOB and nausea, but no vomiting. She has a FH of a father with 2 MI's in his 33's, a prior history of smoking 24 pack-years, and HTN, but no known HL or DM. Of note, the patient notes a stress test 2-3 years ago in [place] which she reports was normal.  The patient also notes a 2-week history of SOB, which comes and goes, which can occur both at rest or with exertion. The SOB does not necessarily always correlate with her chest pain. She notes a >1 year history of 3-pillow orthopnea, but no PND, no leg swelling, no history of CHF. No fevers, chills, cough, ST, congestion.  The patient also notes a 2-3 day history of a constant sharp epigastric pain, 6/10 in severity, radiating to her back. The pain is unchanged by eating food, but appears to worsen 30  minutes after eating. She notes no nausea or vomiting. She notes chronic IBS, but recent stools have been soft (no diarrhea/constipation). She notes occasional blood in her stools, present for many months, which she attributes to her known rectal fissures.  Admission Physical Exam Blood pressure 152/72, pulse 86, temperature 98.1 F (36.7 C), resp. rate 14, height 5' 1.5" (1.562 m), weight 182 lb (82.555 kg), SpO2 98.00%.  General: alert, cooperative, and in no apparent distress HEENT: pupils equal round and reactive to light, vision grossly intact, oropharynx clear and non-erythematous  Neck: supple, no lymphadenopathy Lungs: clear to ascultation bilaterally, normal work of respiration, no wheezes, rales, ronchi Heart: regular rate and rhythm, no murmurs, gallops, or rubs Abdomen: soft, significantly tender to epigastric palpation, non-distended, +bs, no guarding or rebound tenderness, negative murphy's sign  Extremities: no cyanosis, clubbing, or edema. No LE edema, hyperpigmentation, or tenderness Neurologic: alert & oriented X3,  cranial nerves II-XII intact, strength grossly intact, sensation intact to light touch  Admission Labs Basic Metabolic Panel:  Basename  02/08/12 1045   NA  137   K  4.3   CL  101   CO2  23   GLUCOSE  90   BUN  12   CREATININE  0.64   CALCIUM  9.4   MG  --   PHOS  --    Liver Function Tests:  Basename  02/08/12 1045   AST  16   ALT  18   ALKPHOS  82   BILITOT  0.4   PROT  7.6   ALBUMIN  3.5    Basename  02/08/12 1045   LIPASE  25   AMYLASE  --    CBC:  Basename  02/08/12 1045   WBC  6.8   NEUTROABS  4.8   HGB  12.8   HCT  38.1   MCV  88.2   PLT  274     Hospital Course by problem list: 1. Chest Pain - The patient presented with mid-sternal dull moderate intermittent chest pain, non-exertional, present for 2-3 days prior to admission.  EKG showed no abnormalities, cardiac enzymes were negative, and chest x-ray showed no abnormalities.   Given the patient's risk factors of age, FH, HTN, and prior tobacco use, cardiology was consulted for possible unstable angina.  Stress test was performed, which showed no evidence of reversible ischemia.  The etiology of the patient's chest pain is likely stress vs GI.  The patient will follow-up with her PCP.  2. Epigastric Pain - The patient also presented with a 2-3 day history of significant epigastric pain, radiating to her back.  She notes a history of a prior duodenal ulcer, diagnosed in her 76's by EGD in [place], for which she notes she was "treated for 1 year".  Given that this episode likely occurred before widespread knowledge of H Pylori, and the fact that the ulcer was reportedly duodenal, we are concerned about persistent H Pylori infection.  We have sent a stool sample for H pylori antigen, which is pending at the time of discharge, and if positive, we will contact the patient to start triple therapy.  In the meantime, we will discharge with protonix.  During hospitalization, the patient also noted several concerning symptoms, including feeling as if food or water occasionally becomes stuck in her throat while eating, with a few episodes of regurgitation in the past.  She also notes being diagnosed with irritable bowel syndrome by a physician in the past, and notes up to 10 watery bowel movements per day at baseline, though nursing staff did not observe this frequency during her current hospitalization.  We deferred inpatient gastroenterology consult as her current symptoms did not represent an urgent process, but we have made a referral for outpatient gastroenterology follow-up to consider EGD (given occasional dysphagia, and concern for ulcer) vs barium swallow.  3. Hypothyroidism - the patient has a history of hypothyroidism, managed on synthroid.  The patient's medication was continued throughout hospitalization and at discharge.  4. Hypertension - the patient's BP remained stable during  hospitalization.  The patient will be discharged on her home benazepril and metoprolol.  Time spent on discharge: 45 minutes  Discharge Vitals:  BP 95/62  Pulse 77  Temp 98.4 F (36.9 C) (Oral)  Resp 20  Ht 5\' 1"  (1.549 m)  Wt 282 lb (127.914 kg)  BMI 53.28 kg/m2  SpO2 98%  Discharge  Labs:  Results for orders placed during the hospital encounter of 02/08/12 (from the past 24 hour(s))  BASIC METABOLIC PANEL     Status: Normal   Collection Time   02/11/12  5:30 AM      Component Value Range   Sodium 137  135 - 145 mEq/L   Potassium 4.1  3.5 - 5.1 mEq/L   Chloride 104  96 - 112 mEq/L   CO2 23  19 - 32 mEq/L   Glucose, Bld 94  70 - 99 mg/dL   BUN 15  6 - 23 mg/dL   Creatinine, Ser 0.98  0.50 - 1.10 mg/dL   Calcium 9.2  8.4 - 11.9 mg/dL   GFR calc non Af Amer >90  >90 mL/min   GFR calc Af Amer >90  >90 mL/min  CBC     Status: Normal   Collection Time   02/11/12  5:30 AM      Component Value Range   WBC 6.4  4.0 - 10.5 K/uL   RBC 4.15  3.87 - 5.11 MIL/uL   Hemoglobin 12.2  12.0 - 15.0 g/dL   HCT 14.7  82.9 - 56.2 %   MCV 89.9  78.0 - 100.0 fL   MCH 29.4  26.0 - 34.0 pg   MCHC 32.7  30.0 - 36.0 g/dL   RDW 13.0  86.5 - 78.4 %   Platelets 254  150 - 400 K/uL    Signed: Janalyn Harder 02/11/2012, 2:50 PM

## 2012-02-10 NOTE — Plan of Care (Signed)
Problem: Phase II Progression Outcomes Goal: Stress Test if indicated Outcome: Completed/Met Date Met:  02/10/12 Patient scheduled for this am 02/10/2012

## 2012-02-10 NOTE — Progress Notes (Signed)
Patient ID: Kelsey Moore, female   DOB: 06-20-1951, 61 y.o.   MRN: [id number]    Subjective:  Mild dyspnea  Objective:  Filed Vitals:   02/09/12 1406 02/09/12 1410 02/09/12 2130 02/10/12 0600  BP: 122/63 125/75 106/44 119/60  Pulse: 77 83 73 68  Temp:   98.4 F (36.9 C) 97.6 F (36.4 C)  TempSrc:      Resp: 20 20 18 18   Height:      Weight:      SpO2: 100% 100% 92% 100%    Intake/Output from previous day:  Intake/Output Summary (Last 24 hours) at 02/10/12 0827 Last data filed at 02/09/12 1800  Gross per 24 hour  Intake    960 ml  Output      0 ml  Net    960 ml    Physical Exam: Affect appropriate Obese white female HEENT: normal Neck supple with no adenopathy JVP normal no bruits no thyromegaly Lungs clear with no wheezing and good diaphragmatic motion Heart:  S1/S2 no murmur, no rub, gallop or click PMI normal Abdomen: benighn, BS positve, no tenderness, no AAA no bruit.  No HSM or HJR Distal pulses intact with no bruits No edema Neuro non-focal Skin warm and dry No muscular weakness   Lab Results: Basic Metabolic Panel:  Basename 02/10/12 0655 02/09/12 0440 02/08/12 1629  NA 140 140 --  K 4.2 3.5 --  CL 104 109 --  CO2 27 22 --  GLUCOSE 94 94 --  BUN 14 11 --  CREATININE 0.75 0.56 --  CALCIUM 9.3 8.3* --  MG -- -- 2.2  PHOS -- -- --   Liver Function Tests:  Basename 02/08/12 1045  AST 16  ALT 18  ALKPHOS 82  BILITOT 0.4  PROT 7.6  ALBUMIN 3.5    Basename 02/08/12 1045  LIPASE 25  AMYLASE --   CBC:  Basename 02/10/12 0655 02/09/12 0440 02/08/12 1045  WBC 6.7 6.2 --  NEUTROABS -- -- 4.8  HGB 11.8* 10.7* --  HCT 36.7 32.3* --  MCV 90.2 89.5 --  PLT 258 235 --   Cardiac Enzymes:  Basename 02/09/12 0440 02/08/12 2250 02/08/12 1631  CKTOTAL 47 55 50  CKMB 1.1 1.1 1.3  CKMBINDEX -- -- --  TROPONINI <0.30 <0.30 <0.30   BNP: No components found with this basename: POCBNP:3 D-Dimer: No results found for this basename: DDIMER:2  in the last 72 hours Hemoglobin A1C:  Basename 02/08/12 1629  HGBA1C 5.8*   Fasting Lipid Panel:  Basename 02/08/12 1636  CHOL 181  HDL 57  LDLCALC 98  TRIG 131  CHOLHDL 3.2  LDLDIRECT --   Thyroid Function Tests:  Basename 02/08/12 1629  TSH 0.426  T4TOTAL --  T3FREE --  THYROIDAB --   Anemia Panel: No results found for this basename: VITAMINB12,FOLATE,FERRITIN,TIBC,IRON,RETICCTPCT in the last 72 hours  Imaging: Dg Chest 2 View  02/08/2012  *RADIOLOGY REPORT*  Clinical Data: Chest pain.  Shortness of breath.  CHEST - 2 VIEW  Comparison: 04/03/2007 thoracic spine radiographs.  Findings: The cardiomediastinal silhouette is unremarkable. Mild peribronchial thickening is stable. Left mastectomy and axillary surgical changes noted. There is no evidence of focal airspace disease, pulmonary edema, suspicious pulmonary nodule/mass, pleural effusion, or pneumothorax. No acute bony abnormalities are identified.  IMPRESSION: No evidence of acute cardiopulmonary disease.  Original Report Authenticated By: Rosendo Gros, M.D.   US Abdomen Complete  02/09/2012  *RADIOLOGY REPORT*  Clinical Data:  61 year old female with abdominal pain.  ABDOMINAL ULTRASOUND COMPLETE  Comparison:  10/15/2007 renal ultrasound.  Findings:  Gallbladder:  The gallbladder is unremarkable. There is no evidence of gallstones, gallbladder wall thickening, or pericholecystic fluid.  Common Bile Duct:  There is no evidence of intrahepatic or extrahepatic biliary dilation. The CBD measures 4.0 mm in greatest diameter.  Liver:  Diffusely increased echogenicity of the liver is compatible with fatty infiltration.No focal abnormalities are identified.  IVC:  Appears normal.  Pancreas:  Although the pancreas is difficult to visualize in its entirety, no focal pancreatic abnormality is identified.  Spleen:  Within normal limits in size and echotexture.  Right kidney:  The right kidney is normal in size and parenchymal echogenicity.   There is no evidence of solid mass, hydronephrosis or definite renal calculi.  The right kidney measures 11.0 cm.  Left kidney:  The left kidney is normal in size and parenchymal echogenicity.  There is no evidence of solid mass, hydronephrosis or definite renal calculi.   The left kidney measures 11.0 cm.  Abdominal Aorta:  No abdominal aortic aneurysm identified.  There is no evidence of ascites.  IMPRESSION: Fatty infiltration of the liver, otherwise unremarkable abdominal ultrasound.  Original Report Authenticated By: Rosendo Gros, M.D.    Cardiac Studies:  ECG:  NSR no actue ischemic changes 02/09/12   Telemetry: NSR no VT 02/10/2012   Echo: pending  Medications:     . aspirin  325 mg Oral Daily  . benazepril  30 mg Oral Daily  . enoxaparin  40 mg Subcutaneous Q24H  . gi cocktail  30 mL Oral Once  . levothyroxine  100 mcg Oral QAC breakfast  . metoprolol succinate  50 mg Oral Daily  . sodium chloride  3 mL Intravenous Q12H  . DISCONTD: atorvastatin  80 mg Oral q1800       . DISCONTD: sodium chloride 75 mL/hr at 02/08/12 1610    Assessment/Plan:  Chest Pain.  Some gi overtones for stress myovue today.  Consider D/C if normal Dyspnea:  No evidence of CHF.  EF with myovue echo pending.  Consider outpatient OSA w/u HTN:  Well controlled.  Continue current medications and low sodium Dash type diet.   T4:  Continue replacement  Charlton Haws 02/10/2012, 8:27 AM

## 2012-02-11 ENCOUNTER — Inpatient Hospital Stay (HOSPITAL_COMMUNITY): Payer: 59

## 2012-02-11 ENCOUNTER — Encounter: Payer: Self-pay | Admitting: Internal Medicine

## 2012-02-11 LAB — BASIC METABOLIC PANEL
BUN: 15 mg/dL (ref 6–23)
CO2: 23 mEq/L (ref 19–32)
Calcium: 9.2 mg/dL (ref 8.4–10.5)
Creatinine, Ser: 0.63 mg/dL (ref 0.50–1.10)
Glucose, Bld: 94 mg/dL (ref 70–99)

## 2012-02-11 LAB — CBC
HCT: 37.3 % (ref 36.0–46.0)
Hemoglobin: 12.2 g/dL (ref 12.0–15.0)
MCHC: 32.7 g/dL (ref 30.0–36.0)
MCV: 89.9 fL (ref 78.0–100.0)

## 2012-02-11 MED ORDER — TECHNETIUM TC 99M TETROFOSMIN IV KIT: 10.0000 | PACK | Freq: Once | INTRAVENOUS | Status: DC | PRN

## 2012-02-11 MED ORDER — TECHNETIUM TC 99M TETROFOSMIN IV KIT
30.0000 | PACK | Freq: Once | INTRAVENOUS | Status: AC | PRN
  Administered 2012-02-11: 30 via INTRAVENOUS

## 2012-02-11 MED ORDER — TECHNETIUM TC 99M TETROFOSMIN IV KIT
30.0000 | PACK | Freq: Once | INTRAVENOUS | Status: AC | PRN
  Administered 2012-02-10: 30 via INTRAVENOUS

## 2012-02-11 MED ORDER — PANTOPRAZOLE SODIUM 40 MG PO TBEC: 40.0000 mg | DELAYED_RELEASE_TABLET | Freq: Every day | ORAL | Status: DC

## 2012-02-11 MED ORDER — SUCRALFATE 1 G PO TABS: 1.0000 g | ORAL_TABLET | Freq: Two times a day (BID) | ORAL | Status: DC | PRN

## 2012-02-11 NOTE — Discharge Instructions (Signed)
Irritable Bowel Syndrome Irritable Bowel Syndrome (IBS) is caused by a disturbance of normal bowel function. Other terms used are spastic colon, mucous colitis, and irritable colon. It does not require surgery, nor does it lead to cancer. There is no cure for IBS. But with proper diet, stress reduction, and medication, you will find that your problems (symptoms) will gradually disappear or improve. IBS is a common digestive disorder. It usually appears in late adolescence or early adulthood. Women develop it twice as often as men. CAUSES   After food has been digested and absorbed in the small intestine, waste material is moved into the colon (large intestine). In the colon, water and salts are absorbed from the undigested products coming from the small intestine. The remaining residue, or fecal material, is held for elimination. Under normal circumstances, gentle, rhythmic contractions on the bowel walls push the fecal material along the colon towards the rectum. In IBS, however, these contractions are irregular and poorly coordinated. The fecal material is either retained too long, resulting in constipation, or expelled too soon, producing diarrhea. SYMPTOMS   The most common symptom of IBS is pain. It is typically in the lower left side of the belly (abdomen). But it may occur anywhere in the abdomen. It can be felt as heartburn, backache, or even as a dull pain in the arms or shoulders. The pain comes from excessive bowel-muscle spasms and from the buildup of gas and fecal material in the colon. This pain:  Can range from sharp belly (abdominal) cramps to a dull, continuous ache.   Usually worsens soon after eating.   Is typically relieved by having a bowel movement or passing gas.  Abdominal pain is usually accompanied by constipation. But it may also produce diarrhea. The diarrhea typically occurs right after a meal or upon arising in the morning. The stools are typically soft and watery. They are  often flecked with secretions (mucus). Other symptoms of IBS include:  Bloating.   Loss of appetite.   Heartburn.   Feeling sick to your stomach (nausea).   Belching   Vomiting   Gas.  IBS may also cause a number of symptoms that are unrelated to the digestive system:  Fatigue.   Headaches.   Anxiety   Shortness of breath   Difficulty in concentrating.   Dizziness.  These symptoms tend to come and go. DIAGNOSIS   The symptoms of IBS closely mimic the symptoms of other, more serious digestive disorders. So your caregiver may wish to perform a variety of additional tests to exclude these disorders. He/she wants to be certain of learning what is wrong (diagnosis). The nature and purpose of each test will be explained to you. TREATMENT A number of medications are available to help correct bowel function and/or relieve bowel spasms and abdominal pain. Among the drugs available are:  Mild, non-irritating laxatives for severe constipation and to help restore normal bowel habits.   Specific anti-diarrheal medications to treat severe or prolonged diarrhea.   Anti-spasmodic agents to relieve intestinal cramps.   Your caregiver may also decide to treat you with a mild tranquilizer or sedative during unusually stressful periods in your life.  The important thing to remember is that if any drug is prescribed for you, make sure that you take it exactly as directed. Make sure that your caregiver knows how well it worked for you. HOME CARE INSTRUCTIONS    Avoid foods that are high in fat or oils. Some examples are:heavy cream, butter, frankfurters,   sausage, and other fatty meats.   Avoid foods that have a laxative effect, such as fruit, fruit juice, and dairy products.   Cut out carbonated drinks, chewing gum, and "gassy" foods, such as beans and cabbage. This may help relieve bloating and belching.   Bran taken with plenty of liquids may help relieve constipation.   Keep track  of what foods seem to trigger your symptoms.   Avoid emotionally charged situations or circumstances that produce anxiety.   Start or continue exercising.   Get plenty of rest and sleep.  MAKE SURE YOU:    Understand these instructions.   Will watch your condition.   Will get help right away if you are not doing well or get worse.  Document Released: 08/05/2005 Document Revised: 07/25/2011 Document Reviewed: 03/25/2008 [employer] Patient Information 2012 [employer]. 

## 2012-02-11 NOTE — Progress Notes (Signed)
Patient ID: Kelsey Moore, female   DOB: 1951/08/03, 61 y.o.   MRN: [id number]    Subjective:  Continues with intermittent mild chest tightness and mild DOE.  Objective:  Filed Vitals:   02/10/12 0936 02/10/12 1330 02/10/12 2100 02/11/12 0500  BP: 154/64 101/65 113/65 113/72  Pulse: 83 73 72 67  Temp:  97.4 F (36.3 C) 97.9 F (36.6 C) 98.1 F (36.7 C)  TempSrc:   Oral Oral  Resp:  20 20 20   Height:      Weight:      SpO2:  98% 97% 98%    Intake/Output from previous day:  Intake/Output Summary (Last 24 hours) at 02/11/12 0944 Last data filed at 02/10/12 1200  Gross per 24 hour  Intake    360 ml  Output      0 ml  Net    360 ml    Physical Exam:  Obese  HEENT: normal Neck supple Lungs clear Heart:  S1/S2 no murmur, no rub, gallop or click Abdomen: benign, Mild tenderness to palpation. Ext: No edema Neuro non-focal   Lab Results: Basic Metabolic Panel:  Basename 02/11/12 0530 02/10/12 0655 02/08/12 1629  NA 137 140 --  K 4.1 4.2 --  CL 104 104 --  CO2 23 27 --  GLUCOSE 94 94 --  BUN 15 14 --  CREATININE 0.63 0.75 --  CALCIUM 9.2 9.3 --  MG -- -- 2.2  PHOS -- -- --   Liver Function Tests:  Basename 02/08/12 1045  AST 16  ALT 18  ALKPHOS 82  BILITOT 0.4  PROT 7.6  ALBUMIN 3.5    Basename 02/08/12 1045  LIPASE 25  AMYLASE --   CBC:  Basename 02/11/12 0530 02/10/12 0655 02/08/12 1045  WBC 6.4 6.7 --  NEUTROABS -- -- 4.8  HGB 12.2 11.8* --  HCT 37.3 36.7 --  MCV 89.9 90.2 --  PLT 254 258 --   Cardiac Enzymes:  Basename 02/09/12 0440 02/08/12 2250 02/08/12 1631  CKTOTAL 47 55 50  CKMB 1.1 1.1 1.3  CKMBINDEX -- -- --  TROPONINI <0.30 <0.30 <0.30    Basename 02/08/12 1629  HGBA1C 5.8*   Fasting Lipid Panel:  Basename 02/08/12 1636  CHOL 181  HDL 57  LDLCALC 98  TRIG 131  CHOLHDL 3.2  LDLDIRECT --   Thyroid Function Tests:  Basename 02/08/12 1629  TSH 0.426  T4TOTAL --  T3FREE --  THYROIDAB --    Medications:       . aspirin  325 mg Oral Daily  . benazepril  30 mg Oral Daily  . enoxaparin  40 mg Subcutaneous Q24H  . gi cocktail  30 mL Oral Once  . levothyroxine  100 mcg Oral QAC breakfast  . metoprolol succinate  50 mg Oral Daily  . sodium chloride  3 mL Intravenous Q12H       Assessment/Plan:  Chest Pain.  ? GI etiology; patient had resting myoview images this Am; await results; if neg, no plans for further cardiac eval. Echo shows normal LV function; may need GI eval. Dyspnea:  No evidence of CHF. Consider outpatient OSA w/u HTN:  Well controlled.  Continue current medications.  Olga Millers 02/11/2012, 9:44 AM

## 2012-02-11 NOTE — Progress Notes (Signed)
UR Completed Monnica Saltsman Graves-Bigelow, RN,BSN [phone]  

## 2012-02-11 NOTE — Plan of Care (Signed)
Problem: Food- and Nutrition-Related Knowledge Deficit (NB-1.1) Goal: Nutrition education Formal process to instruct or train a patient/client in a skill or to impart knowledge to help patients/clients voluntarily manage or modify food choices and eating behavior to maintain or improve health.  Outcome: Completed/Met Date Met:  02/11/12 RD consult per patient request regarding weight loss education. Reviewed guidelines and recommendations. Handouts provided from [employer]: Weight Loss Tips. Questions answered. PO intake 100% on a Heart Healthy diet. BMI = 53.4 kg/m2 (Obesity Class III). Patient awaiting discharge; no further nutrition intervention at this time.  Alger Memos, RD, LDN Pager #: [phone]

## 2012-02-11 NOTE — Care Management Note (Signed)
    Page 1 of 1   02/11/2012     3:34:08 PM   CARE MANAGEMENT NOTE 02/11/2012  Patient:  Kelsey Moore, Kelsey Moore   Account Number:  [id number]  Date Initiated:  02/11/2012  Documentation initiated by:  GRAVES-BIGELOW,Lycia Sachdeva  Subjective/Objective Assessment:   Pt admitted with cp and epigastric pain. S/p 2 day stress test. No ischemia. Plan for d/c home today no needs from CM at this time.     Action/Plan:   Anticipated DC Date:  02/11/2012   Anticipated DC Plan:  HOME/SELF CARE      DC Planning Services  CM consult      Choice offered to / List presented to:             Status of service:  Completed, signed off Medicare Important Message given?   (If response is "NO", the following Medicare IM given date fields will be blank) Date Medicare IM given:   Date Additional Medicare IM given:    Discharge Disposition:  HOME/SELF CARE  Per UR Regulation:  Reviewed for med. necessity/level of care/duration of stay  If discussed at Long Length of Stay Meetings, dates discussed:    Comments:

## 2012-02-11 NOTE — Progress Notes (Addendum)
Subjective: Patient underwent second part of stress test this morning.  Patient continued to note epigastric pain overnight.  Patient would like to speak with a dietician regarding weight loss.  Objective: Vital signs in last 24 hours: Filed Vitals:   02/10/12 0936 02/10/12 1330 02/10/12 2100 02/11/12 0500  BP: 154/64 101/65 113/65 113/72  Pulse: 83 73 72 67  Temp:  97.4 F (36.3 C) 97.9 F (36.6 C) 98.1 F (36.7 C)  TempSrc:   Oral Oral  Resp:  20 20 20   Height:      Weight:      SpO2:  98% 97% 98%   Weight change:   Intake/Output Summary (Last 24 hours) at 02/11/12 1005 Last data filed at 02/10/12 1200  Gross per 24 hour  Intake    360 ml  Output      0 ml  Net    360 ml    PEX General: alert, cooperative, and in no apparent distress HEENT: pupils equal round and reactive to light, vision grossly intact, oropharynx clear and non-erythematous  Neck: supple, no lymphadenopathy Lungs: clear to ascultation bilaterally, normal work of respiration, no wheezes, rales, ronchi Heart: regular rate and rhythm, no murmurs, gallops, or rubs Abdomen: soft, tender to epigastric palpation, non-distended, +bs, no guarding or rebound tenderness Extremities: no cyanosis, clubbing, or edema Neurologic: alert & oriented X3, cranial nerves II-XII intact, strength grossly intact, sensation intact to light touch  Lab Results: Basic Metabolic Panel:  Lab 02/11/12 8469 02/10/12 0655 02/08/12 1629  NA 137 140 --  K 4.1 4.2 --  CL 104 104 --  CO2 23 27 --  GLUCOSE 94 94 --  BUN 15 14 --  CREATININE 0.63 0.75 --  CALCIUM 9.2 9.3 --  MG -- -- 2.2  PHOS -- -- --   Liver Function Tests:  Lab 02/08/12 1045  AST 16  ALT 18  ALKPHOS 82  BILITOT 0.4  PROT 7.6  ALBUMIN 3.5    Lab 02/08/12 1045  LIPASE 25  AMYLASE --   CBC:  Lab 02/11/12 0530 02/10/12 0655 02/08/12 1045  WBC 6.4 6.7 --  NEUTROABS -- -- 4.8  HGB 12.2 11.8* --  HCT 37.3 36.7 --  MCV 89.9 90.2 --  PLT 254 258 --    Cardiac Enzymes:  Lab 02/09/12 0440 02/08/12 2250 02/08/12 1631  CKTOTAL 47 55 50  CKMB 1.1 1.1 1.3  CKMBINDEX -- -- --  TROPONINI <0.30 <0.30 <0.30   BNP:  Lab 02/08/12 1631  PROBNP 47.4   Hemoglobin A1C:  Lab 02/08/12 1629  HGBA1C 5.8*   Fasting Lipid Panel:  Lab 02/08/12 1636  CHOL 181  HDL 57  LDLCALC 98  TRIG 131  CHOLHDL 3.2  LDLDIRECT --   Thyroid Function Tests:  Lab 02/08/12 1629  TSH 0.426  T4TOTAL --  FREET4 --  T3FREE --  THYROIDAB --   Urinalysis:  Lab 02/08/12 1130  COLORURINE YELLOW  LABSPEC 1.019  PHURINE 5.0  GLUCOSEU NEGATIVE  HGBUR NEGATIVE  BILIRUBINUR NEGATIVE  KETONESUR NEGATIVE  PROTEINUR NEGATIVE  UROBILINOGEN 0.2  NITRITE NEGATIVE  LEUKOCYTESUR NEGATIVE    Medications: I have reviewed the patient's current medications. Scheduled Meds:    . aspirin  325 mg Oral Daily  . benazepril  30 mg Oral Daily  . enoxaparin  40 mg Subcutaneous Q24H  . gi cocktail  30 mL Oral Once  . levothyroxine  100 mcg Oral QAC breakfast  . metoprolol succinate  50 mg Oral  Daily  . sodium chloride  3 mL Intravenous Q12H   Continuous Infusions:  PRN Meds:.acetaminophen, acetaminophen, alum & mag hydroxide-simeth, morphine injection, nitroGLYCERIN, ondansetron (ZOFRAN) IV, ondansetron, technetium tetrofosmin, technetium tetrofosmin, DISCONTD: technetium tetrofosmin  Assessment/Plan: The patient is a 61 yo woman, history of HTN, prior smoking, FH of CAD, presenting with chest pain and epigastric pain.   # Chest Pain/SOB - The patient's chest pain is atypical, but given risk factors of age, prior smoking, HTN, and FH, concerned about UA.  Otherwise, most likely diagnosis is GI (GERD vs PUD) vs anxiety vs msk.  No lab/radiologic evidence of cholecystitis, PNA, pericarditis, pneumothorax, dissection. -cardiac enzymes neg x3, pro-BNP negative -Cardiology consulted, greatly appreciate assistance -outpt stress test today, results pending -echo shows  no wall motion abnormalities -continue metoprolol, benazepril, aspirin  -patient declines lipitor out of fear of side effects.  Given patient preference, lipid profile, and negative CE x3, we can discontinue lipitor for now -Maalox prn, protonix  -morphine prn, zofran prn   # Epigastric pain - Most likely GERD.  Also possibly PUD.  No evidence of cholecystitis, pancreatitis, PBC, SBO, UTI. -morphine for pain  -maalox prn, protonix  -stool antigen for H Pylori, will f/u after discharge and recommend H pylori treatment if needed -gastroenterology follow-up at discharge  # Hypothyroidism - chronic, stable  -continue synthroid   # Hypertension - chronic, stable  -continue metoprolol, benazepril   # Prophy - lovenox  # Dispo - discharge today if stress test shows no reversible ischemia   LOS: 3 days   Janalyn Harder 02/11/2012, 10:05 AM

## 2012-02-12 LAB — H.PYLORI ANTIGEN, STOOL

## 2012-02-13 ENCOUNTER — Encounter: Payer: Self-pay | Admitting: Internal Medicine

## 2012-02-14 ENCOUNTER — Encounter: Payer: Self-pay | Admitting: Internal Medicine

## 2012-02-14 ENCOUNTER — Ambulatory Visit (INDEPENDENT_AMBULATORY_CARE_PROVIDER_SITE_OTHER): Payer: 59 | Admitting: Internal Medicine

## 2012-02-14 ENCOUNTER — Other Ambulatory Visit (INDEPENDENT_AMBULATORY_CARE_PROVIDER_SITE_OTHER): Payer: 59

## 2012-02-14 VITALS — BP 128/78 | HR 135 | Ht 61.0 in | Wt 282.0 lb

## 2012-02-14 DIAGNOSIS — R1013 Epigastric pain: Secondary | ICD-10-CM

## 2012-02-14 DIAGNOSIS — K529 Noninfective gastroenteritis and colitis, unspecified: Secondary | ICD-10-CM

## 2012-02-14 DIAGNOSIS — K3189 Other diseases of stomach and duodenum: Secondary | ICD-10-CM

## 2012-02-14 DIAGNOSIS — R197 Diarrhea, unspecified: Secondary | ICD-10-CM

## 2012-02-14 DIAGNOSIS — K589 Irritable bowel syndrome without diarrhea: Secondary | ICD-10-CM

## 2012-02-14 LAB — IGA: IgA: 297 mg/dL (ref 68–378)

## 2012-02-14 MED ORDER — HYOSCYAMINE SULFATE 0.125 MG SL SUBL: 0.1250 mg | SUBLINGUAL_TABLET | SUBLINGUAL | Status: DC | PRN

## 2012-02-14 MED ORDER — LOPERAMIDE HCL 2 MG PO TABS: 2.0000 mg | ORAL_TABLET | Freq: Four times a day (QID) | ORAL | Status: AC | PRN

## 2012-02-14 NOTE — Patient Instructions (Addendum)
You have been scheduled for an endoscopy with propofol. Please follow written instructions given to you at your visit today.  Your physician has requested that you go to the basement for  lab work before leaving today.   We have sent the following medications to your pharmacy for you to pick up at your convenience: Levsin, take as directed Imodium, take as directed.  Once Dr. Rhea Belton reviews your records he will decide if you will a Flexible sigmoidoscopy.

## 2012-02-14 NOTE — Progress Notes (Signed)
Patient ID: Kelsey Moore, female   DOB: 09/07/1950, 60 y.o.   MRN: [id number]  SUBJECTIVE: HPI Kelsey Moore is a 60 yo female with PMH of PUD, hypothyroidism, hypertension, breast cancer status post bilateral mastectomy in 1997, IBS, anxiety and obesity who is seen in consultation at the request of Dr. Ryan Brown for evaluation of epigastric pain and atypical chest pain. The patient was recently admitted to the hospital after she presented to urgent care with atypical chest pain and epigastric pain. This occurred at 3 AM last Saturday morning. She reports the pain was intense in her epigastrium radiating into her back. She also had bilateral anterior chest pain associated with this. She was admitted to the hospital ruled out for myocardial infarction underwent stress testing which was normal revealing no ischemia. Echocardiogram was also unremarkable. She is referred for evaluation of her GI complaints. She reports she has a history of irritable bowel syndrome which was diagnosed around 2010 after her husband died, she lost her job and moved back to [place]. During this time she developed significant diarrhea as many as 15 times a day often waking her from sleep at night. She reports having undergone testing at that time including colonoscopy in 2011 which was unremarkable except for diverticulosis. She was using Imodium with some help but not complete benefit. She reports she was able to get another job in [employer], and moved back to [place] in the last 18 months. Since returning her diarrhea has improved but not resolved. She's currently still having loose stools on the order of 10 times daily. There is no rectal bleeding or melena. She does continue to occasionally have diarrhea in the middle in it. Most often her diarrhea is not accompanied by abdominal pain. Separate from her diarrhea she does report intermittent epigastric abdominal pain. This is episodic and does drink only occur at night. She has not  noted any heartburn but does note occasional regurgitation. She reports some mild dysphasia as well as early satiety. She has not lost weight.  Of note in the hospital and H. pylori stool antigen was negative. No other stool studies were performed.  Review of Systems  As per history of present illness, otherwise negative   Past Medical History  Diagnosis Date  . Hypertension   . Hypothyroid     multinodular goiter by US 2004  . Duodenal ulcer     At age 18s  . Breast cancer     s/p Left mastectomy 1998, s/p R radical mastectomy 1997, lymph nodes negative, in remission  . IBS (irritable bowel syndrome)   . Anal fissure   . Anxiety   . Obesity     Current Outpatient Prescriptions  Medication Sig Dispense Refill  . benazepril (LOTENSIN) 10 MG tablet Take 10 mg by mouth daily. Take along with 20mg tablet to equal 30mg dose.      . benazepril (LOTENSIN) 20 MG tablet Take 20 mg by mouth daily. Take along with 10mg tablet to equal 30mg dose.      . Coenzyme Q10 (CO Q 10 PO) Take 2 tablets by mouth daily.      . levothyroxine (SYNTHROID, LEVOTHROID) 100 MCG tablet Take 100 mcg by mouth daily.      . metoprolol succinate (TOPROL-XL) 50 MG 24 hr tablet Take 50 mg by mouth daily. Take with or immediately following a meal.      . hyoscyamine (LEVSIN/SL) 0.125 MG SL tablet Place 1 tablet (0.125 mg total) under the tongue   every 4 (four) hours as needed for cramping.  30 tablet  0  . loperamide (IMODIUM A-D) 2 MG tablet Take 1 tablet (2 mg total) by mouth 4 (four) times daily as needed for diarrhea or loose stools.  30 tablet  0    Allergies  Allergen Reactions  . Codeine Nausea Only  . Sulfa Antibiotics Rash    Childhood allergy.    Family History  Problem Relation Age of Onset  . Heart disease Mother 70    MI   . Hypertension Mother   . Hypertension Father   . Heart disease Father 60    MI  . Cancer Mother     Breast cancer     History  Substance Use Topics  . Smoking status:  Former Smoker -- 1.0 packs/day for 24 years    Quit date: 08/19/1990  . Smokeless tobacco: Never Used  . Alcohol Use: 0.6 oz/week    1 Shots of liquor per week    OBJECTIVE: BP 128/78  Pulse 135  Ht 5' 1" (1.549 m)  Wt 282 lb (127.914 kg)  BMI 53.28 kg/m2  SpO2 96% Constitutional: Well-developed and well-nourished. No distress. HEENT: Normocephalic and atraumatic. Oropharynx is clear and moist. No oropharyngeal exudate. Conjunctivae are normal. Pupils are equal round and reactive to light. No scleral icterus. Neck: Neck supple. Trachea midline. Cardiovascular: Normal rate, regular rhythm and intact distal pulses. No M/R/G Pulmonary/chest: Effort normal and breath sounds normal. No wheezing, rales or rhonchi. Abdominal: Soft, obese, nontender, nondistended. Bowel sounds active throughout. There are no masses palpable. No hepatosplenomegaly. Extremities: no clubbing, cyanosis, or edema Lymphadenopathy: No cervical adenopathy noted. Neurological: Alert and oriented to person place and time. Skin: Skin is warm and dry. No rashes noted. Psychiatric: Normal mood and affect. Behavior is normal.  Labs and Imaging -- CBC    Component Value Date/Time   WBC 6.4 02/11/2012 0530   RBC 4.15 02/11/2012 0530   HGB 12.2 02/11/2012 0530   HCT 37.3 02/11/2012 0530   PLT 254 02/11/2012 0530   MCV 89.9 02/11/2012 0530   MCH 29.4 02/11/2012 0530   MCHC 32.7 02/11/2012 0530   RDW 14.0 02/11/2012 0530   LYMPHSABS 1.6 02/08/2012 1045   MONOABS 0.4 02/08/2012 1045   EOSABS 0.0 02/08/2012 1045   BASOSABS 0.0 02/08/2012 1045    CMP     Component Value Date/Time   NA 137 02/11/2012 0530   K 4.1 02/11/2012 0530   CL 104 02/11/2012 0530   CO2 23 02/11/2012 0530   GLUCOSE 94 02/11/2012 0530   BUN 15 02/11/2012 0530   CREATININE 0.63 02/11/2012 0530   CALCIUM 9.2 02/11/2012 0530   PROT 7.6 02/08/2012 1045   ALBUMIN 3.5 02/08/2012 1045   AST 16 02/08/2012 1045   ALT 18 02/08/2012 1045   ALKPHOS 82 02/08/2012 1045    BILITOT 0.4 02/08/2012 1045   GFRNONAA >90 02/11/2012 0530   GFRAA >90 02/11/2012 0530    Lipase     Component Value Date/Time   LIPASE 25 02/08/2012 1045    US dated 02/08/12 ABDOMINAL ULTRASOUND COMPLETE   Comparison:  10/15/2007 renal ultrasound.   Findings:   Gallbladder:  The gallbladder is unremarkable. There is no evidence of gallstones, gallbladder wall thickening, or pericholecystic fluid.   Common Bile Duct:  There is no evidence of intrahepatic or extrahepatic biliary dilation. The CBD measures 4.0 mm in greatest diameter.   Liver:  Diffusely increased echogenicity of the liver is compatible with   fatty infiltration.No focal abnormalities are identified.   IVC:  Appears normal.   Pancreas:  Although the pancreas is difficult to visualize in its entirety, no focal pancreatic abnormality is identified.   Spleen:  Within normal limits in size and echotexture.   Right kidney:  The right kidney is normal in size and parenchymal echogenicity.  There is no evidence of solid mass, hydronephrosis or definite renal calculi.  The right kidney measures 11.0 cm.   Left kidney:  The left kidney is normal in size and parenchymal echogenicity.  There is no evidence of solid mass, hydronephrosis or definite renal calculi.   The left kidney measures 11.0 cm.   Abdominal Aorta:  No abdominal aortic aneurysm identified.   There is no evidence of ascites.   IMPRESSION: Fatty infiltration of the liver, otherwise unremarkable abdominal ultrasound.   ASSESSMENT AND PLAN: 60 yo female with PMH of PUD, hypothyroidism, hypertension, breast cancer status post bilateral mastectomy in 1997, IBS, anxiety and obesity who is seen in consultation at the request of Dr. Ryan Brown for evaluation of epigastric pain and atypical chest pain.  1. Epigastric pain/atypical CP -- the patient did have a recent negative and unrevealing workup from a cardiovascular standpoint. She does have a history  of PUD and is not currently on PPI therapy. She is given a prescription for pantoprazole and sucralfate on discharge from the hospital but she is yet to fill this medication. I recommended upper endoscopy for direct visualization of her upper GI tract. She is having some mild dysphagia and I like to rule out peptic ulcer disease and gastritis as the cause of her ongoing symptoms. I will plan small bowel biopsies given her chronic diarrhea. For now she hold off on PPI plaque endoscopy, but she may in fact need such a medication in the future. I will give her prescription for Levsin to be used as needed for abdominal pain/spasm. She should take this as directed. Also order a celiac panel today to help exclude celiac disease.  2. Diarrhea -- this is a chronic issue for her but does seem to be impacting her life. It seems to respond somewhat to Imodium which she can continue when necessary. I like to get records from her colonoscopy in 2011 to see if random biopsies were done to rule out microscopic colitis. This has not been performed, then she may need flexible sigmoidoscopy for random biopsy. Also perform stool studies to include C. difficile, fecal elastase, ova and parasite, white blood cells, and stool culture.  Further recommendations after the above mentioned workup is complete.       

## 2012-02-17 ENCOUNTER — Other Ambulatory Visit: Payer: 59

## 2012-02-17 ENCOUNTER — Telehealth: Payer: Self-pay | Admitting: Gastroenterology

## 2012-02-17 ENCOUNTER — Other Ambulatory Visit: Payer: Self-pay | Admitting: Internal Medicine

## 2012-02-17 DIAGNOSIS — R1013 Epigastric pain: Secondary | ICD-10-CM

## 2012-02-17 NOTE — Telephone Encounter (Signed)
Left another vm for pt to call me back regarding the re-schedule of her endoscopy.

## 2012-02-17 NOTE — Telephone Encounter (Signed)
lvm for pt to call me back about rescheduling her colonoscopy. CNRA reviewed her chart and procedure needs to be done at the hospital.

## 2012-02-18 ENCOUNTER — Encounter: Payer: 59 | Admitting: Internal Medicine

## 2012-02-18 LAB — FECAL FAT QUALITATIVE: NEUTRAL FAT: NORMAL

## 2012-02-21 LAB — CLOSTRIDIUM DIFFICILE BY PCR: Toxigenic C. Difficile by PCR: NOT DETECTED

## 2012-02-21 LAB — STOOL CULTURE

## 2012-02-25 ENCOUNTER — Telehealth: Payer: Self-pay | Admitting: Gastroenterology

## 2012-02-25 ENCOUNTER — Telehealth: Payer: Self-pay | Admitting: Internal Medicine

## 2012-02-25 NOTE — Telephone Encounter (Signed)
LVM for pt to call me back, I also said if she is in pain and since Dr. Rhea Belton is out of the office today she can come in and be seen by one of our PA's.

## 2012-02-26 ENCOUNTER — Other Ambulatory Visit: Payer: Self-pay | Admitting: Gastroenterology

## 2012-02-26 ENCOUNTER — Telehealth: Payer: Self-pay | Admitting: Gastroenterology

## 2012-02-26 NOTE — Telephone Encounter (Signed)
Spoke to pt. Per Dr. Rhea Belton she will have her procedure at [facility] on 03/04/2012, pt is fine with this. She will come into the office to let me give her new instructions on prepping.

## 2012-02-27 ENCOUNTER — Telehealth: Payer: Self-pay | Admitting: Gastroenterology

## 2012-02-27 NOTE — Telephone Encounter (Signed)
Pt called and said she did not want to do the Flex sig, just an Endo.

## 2012-02-27 NOTE — Telephone Encounter (Signed)
Pt called back and has decided to have the Flex sig along with the endoscopy. She will be in tomorrow morning for her instructions

## 2012-02-28 ENCOUNTER — Encounter: Payer: Self-pay | Admitting: Internal Medicine

## 2012-02-28 NOTE — Telephone Encounter (Signed)
CMA returned Pt's call.

## 2012-02-28 NOTE — Progress Notes (Signed)
Patient ID: Kelsey Moore, female   DOB: 1951-08-09, 61 y.o.   MRN: [id number] Pt came to ofc today to receive instructions for a Flex Sig added on to her already scheduled EGD. Pt refused to listen to the CMA, Ok Anis,  who assisted her and stated she didn't feel well. I went to the room and pt stated she has read the info and she's late and she will sign the papers. I asked pt how she was feeling and she stated she had a headache. I asked if she was on medication for HTN and she stated yes. I offered to take her BP and she refused stating it was probably d/t her dentist cancelling her appt. She signed the consent form and left stating she was going to work.  Less than 3 minutes later, I received a call stating the pt was in the lobby and crying. Found the pt who was in a chair and I asked what was wrong. Pt stated she just didn't feel well. I asked if it was the headache and she stated she was just stressed. She did agree to go to an exam room where her VS were: pulse 84,BP 118/76, resp 20. We talked about her job, the cancelled appt and other things and she then stated she is feeling better; she is going home to change clothes and then go to work. Instructed pt to call us for problems and she stated understanding.

## 2012-03-04 ENCOUNTER — Ambulatory Visit (HOSPITAL_COMMUNITY)
Admission: RE | Admit: 2012-03-04 | Discharge: 2012-03-04 | Disposition: A | Discharge status: Home / Self Care | Payer: 59 | Source: Ambulatory Visit | Attending: Internal Medicine | Admitting: Internal Medicine

## 2012-03-04 ENCOUNTER — Other Ambulatory Visit: Payer: Self-pay | Admitting: *Deleted

## 2012-03-04 ENCOUNTER — Encounter (HOSPITAL_COMMUNITY)
Admission: RE | Disposition: A | Discharge status: Home / Self Care | Payer: Self-pay | Source: Ambulatory Visit | Attending: Internal Medicine

## 2012-03-04 ENCOUNTER — Encounter (HOSPITAL_COMMUNITY): Payer: Self-pay | Admitting: Internal Medicine

## 2012-03-04 DIAGNOSIS — R1013 Epigastric pain: Secondary | ICD-10-CM | POA: Insufficient documentation

## 2012-03-04 DIAGNOSIS — K2970 Gastritis, unspecified, without bleeding: Secondary | ICD-10-CM | POA: Insufficient documentation

## 2012-03-04 DIAGNOSIS — Z79899 Other long term (current) drug therapy: Secondary | ICD-10-CM | POA: Insufficient documentation

## 2012-03-04 DIAGNOSIS — K589 Irritable bowel syndrome without diarrhea: Secondary | ICD-10-CM | POA: Insufficient documentation

## 2012-03-04 DIAGNOSIS — R197 Diarrhea, unspecified: Secondary | ICD-10-CM

## 2012-03-04 DIAGNOSIS — Z853 Personal history of malignant neoplasm of breast: Secondary | ICD-10-CM | POA: Insufficient documentation

## 2012-03-04 DIAGNOSIS — R1310 Dysphagia, unspecified: Secondary | ICD-10-CM | POA: Insufficient documentation

## 2012-03-04 DIAGNOSIS — I10 Essential (primary) hypertension: Secondary | ICD-10-CM | POA: Insufficient documentation

## 2012-03-04 DIAGNOSIS — K2980 Duodenitis without bleeding: Secondary | ICD-10-CM | POA: Insufficient documentation

## 2012-03-04 DIAGNOSIS — E039 Hypothyroidism, unspecified: Secondary | ICD-10-CM | POA: Insufficient documentation

## 2012-03-04 DIAGNOSIS — K648 Other hemorrhoids: Secondary | ICD-10-CM

## 2012-03-04 DIAGNOSIS — K529 Noninfective gastroenteritis and colitis, unspecified: Secondary | ICD-10-CM

## 2012-03-04 DIAGNOSIS — K269 Duodenal ulcer, unspecified as acute or chronic, without hemorrhage or perforation: Secondary | ICD-10-CM | POA: Insufficient documentation

## 2012-03-04 DIAGNOSIS — K620 Anal polyp: Secondary | ICD-10-CM | POA: Insufficient documentation

## 2012-03-04 DIAGNOSIS — K621 Rectal polyp: Secondary | ICD-10-CM | POA: Insufficient documentation

## 2012-03-04 HISTORY — PX: FLEXIBLE SIGMOIDOSCOPY: SHX5431

## 2012-03-04 HISTORY — PX: ESOPHAGOGASTRODUODENOSCOPY: SHX5428

## 2012-03-04 SURGERY — EGD (ESOPHAGOGASTRODUODENOSCOPY)
Anesthesia: Moderate Sedation

## 2012-03-04 MED ORDER — MIDAZOLAM HCL 10 MG/2ML IJ SOLN
INTRAMUSCULAR | Status: DC | PRN
  Administered 2012-03-04 (×4): 2 mg via INTRAVENOUS
  Administered 2012-03-04 (×2): 1 mg via INTRAVENOUS

## 2012-03-04 MED ORDER — FENTANYL CITRATE 0.05 MG/ML IJ SOLN
INTRAMUSCULAR | Status: DC | PRN
  Administered 2012-03-04: 25 ug via INTRAVENOUS
  Administered 2012-03-04: 12.5 ug via INTRAVENOUS
  Administered 2012-03-04: 25 ug via INTRAVENOUS

## 2012-03-04 MED ORDER — PANTOPRAZOLE SODIUM 40 MG PO TBEC: DELAYED_RELEASE_TABLET | ORAL | Status: DC

## 2012-03-04 MED ORDER — DIPHENHYDRAMINE HCL 50 MG/ML IJ SOLN
INTRAMUSCULAR | Status: AC
  Filled 2012-03-04: qty 1

## 2012-03-04 MED ORDER — SODIUM CHLORIDE 0.9 % IV SOLN
Freq: Once | INTRAVENOUS | Status: AC
  Administered 2012-03-04: 500 mL via INTRAVENOUS

## 2012-03-04 MED ORDER — BUTAMBEN-TETRACAINE-BENZOCAINE 2-2-14 % EX AERO
INHALATION_SPRAY | CUTANEOUS | Status: DC | PRN
  Administered 2012-03-04: 2 via TOPICAL

## 2012-03-04 MED ORDER — PRAMOXINE-HC 1-2.5 % EX CREA: TOPICAL_CREAM | CUTANEOUS | Status: DC

## 2012-03-04 MED ORDER — FENTANYL CITRATE 0.05 MG/ML IJ SOLN
INTRAMUSCULAR | Status: AC
  Filled 2012-03-04: qty 2

## 2012-03-04 MED ORDER — MIDAZOLAM HCL 10 MG/2ML IJ SOLN
INTRAMUSCULAR | Status: AC
  Filled 2012-03-04: qty 2

## 2012-03-04 NOTE — Op Note (Signed)
[facility] [address] [place], [place]  [address]  ENDOSCOPY PROCEDURE REPORT  PATIENT:  Kelsey Moore, Kelsey Moore  MR#:  [id number] BIRTHDATE:  [birth year], 60 yrs. old  GENDER:  female ENDOSCOPIST:  Carie Caddy. Chesnee Floren, MD  PROCEDURE DATE:  03/04/2012 PROCEDURE:  EGD with biopsy for H. pylori 19147, EGD with biopsy, 43239 ASA CLASS:  Class III INDICATIONS:  GERD, epigastric pain, dysphagia MEDICATIONS:   Versed 10 mg IV, Fentanyl 75 mcg IV TOPICAL ANESTHETIC:  Cetacaine Spray  DESCRIPTION OF PROCEDURE:   After the risks benefits and alternatives of the procedure were thoroughly explained, informed consent was obtained.  The Pentax Gastroscope [id number] endoscope was introduced through the mouth and advanced to the second portion of the duodenum, without limitations.  The instrument was slowly withdrawn as the mucosa was fully examined. <<PROCEDUREIMAGES>>  The esophagus and gastroesophageal junction were completely normal in appearance. Random biopsies were obtained and sent to pathology.  Mild gastritis characterized by patchy erythema was found antrum. Biopsies of the antrum and body of the stomach were obtained and sent to pathology.  Otherwise normal stomach.  An small, mostly superficial ulcer with clean base was found in the bulb of the duodenum along with mild bulbar duodenitis.  Normal otherwise in the second portion of the duodenum.    Retroflexed views revealed no abnormalities.    The scope was then withdrawn from the patient and the procedure completed.  COMPLICATIONS:  None  ENDOSCOPIC IMPRESSION: 1) Normal esophagus. Multiple biopsies taken to exclude eosinophilic esophagitis 2) Mild gastritis in the antrum. Multiple biopsies taken to exclude H. pylori 3) Otherwise normal stomach 4) Ulcer in the bulb of duodenum 5) Normal in the second portion duodenum  RECOMMENDATIONS: 1) Await pathology results 2) avoid NSAIDS 3) follow-up of helicobacter pylori status,  treat if indicated 4) Begin pantoprazole 40 mg daily for acid suppression.  Carie Caddy. Amaziah Ghosh, MD  CC:  The Patient  n. eSIGNED:   Carie Caddy. Willine Schwalbe at 03/04/2012 10:41 AM  Wende Bushy, [phone]

## 2012-03-04 NOTE — Op Note (Signed)
[facility] [address] [place], [place]  [address]  FLEXIBLE SIGMOIDOSCOPY PROCEDURE REPORT  PATIENT:  Kelsey Moore, Kelsey Moore  MR#:  [id number] BIRTHDATE:  1951/03/09, 60 yrs. old  GENDER:  female ENDOSCOPIST:  Carie Caddy. Calloway Andrus, MD  PROCEDURE DATE:  03/04/2012 PROCEDURE:  Flexible Sigmoidoscopy with biopsy and polypectomy ASA CLASS:  Class III INDICATIONS:  diarrhea MEDICATIONS:   Fentanyl 62.5 mcg IV, Versed 10 mg IV  DESCRIPTION OF PROCEDURE:   After the risks benefits and alternatives of the procedure were thoroughly explained, informed consent was obtained.  Digital rectal exam was performed and revealed no rectal masses and fissure.   The Pentax Colonoscope [id number]) endoscope was introduced through the anus and advanced to the splenic flexure, without limitations.  The quality of the prep was adequate.  The instrument was then slowly withdrawn as the mucosa was fully examined. <<PROCEDUREIMAGES>>  Endoscopically normal mucosa without edema, ulceration. Random biopsies were obtained and sent to pathology given diarrhea.  Mild diverticulosis was found in the sigmoid colon.  A 3 mm sessile polyp was found in the rectum. Cold biopsy polypectomy was performed. Specimen was retrieved and sent to pathology. Retroflexed views in the rectum revealed Internal hemorrhoids were seen on retroflexion.    The scope was then withdrawn from the patient and the procedure terminated.  COMPLICATIONS:  None  ENDOSCOPIC IMPRESSION: 1) Normal mucosa. Random biopsies performed to exclude microscopic colitis. 2) Mild diverticulosis in the sigmoid colon 3) Sessile polyp in the rectum. Removed and sent to pathology 4) Internal hemorrhoids were seen on retroflexion.  RECOMMENDATIONS: 1) await biopsy results 2) fiber rich diet 3) Proctofoam BID x 7 days to treat hemorrhoidal inflammation. 4) If fissure continues to be problematic, then can give trial of diltiazem gel.  Carie Caddy.  Gavino Fouch, MD  CC:  The Patient  n. eSIGNED:   Carie Caddy. Maxen Rowland at 03/04/2012 10:59 AM  Wende Bushy, [phone]

## 2012-03-04 NOTE — H&P (View-Only) (Signed)
Patient ID: Kelsey Moore, female   DOB: 04/19/1951, 61 y.o.   MRN: [id number]  SUBJECTIVE: HPI Kelsey Moore is a 61 yo female with PMH of PUD, hypothyroidism, hypertension, breast cancer status post bilateral mastectomy in 01-Feb-1996, IBS, anxiety and obesity who is seen in consultation at the request of Dr. Janalyn Harder for evaluation of epigastric pain and atypical chest pain. The patient was recently admitted to the hospital after she presented to urgent care with atypical chest pain and epigastric pain. This occurred at 3 AM last Saturday morning. She reports the pain was intense in her epigastrium radiating into her back. She also had bilateral anterior chest pain associated with this. She was admitted to the hospital ruled out for myocardial infarction underwent stress testing which was normal revealing no ischemia. Echocardiogram was also unremarkable. She is referred for evaluation of her GI complaints. She reports she has a history of irritable bowel syndrome which was diagnosed around 01/31/2009 after her husband died, she lost her job and moved back to [place]. During this time she developed significant diarrhea as many as 15 times a day often waking her from sleep at night. She reports having undergone testing at that time including colonoscopy in 31-Jan-2010 which was unremarkable except for diverticulosis. She was using Imodium with some help but not complete benefit. She reports she was able to get another job in [place], and moved back to [place] in the last 18 months. Since returning her diarrhea has improved but not resolved. She's currently still having loose stools on the order of 10 times daily. There is no rectal bleeding or melena. She does continue to occasionally have diarrhea in the middle in it. Most often her diarrhea is not accompanied by abdominal pain. Separate from her diarrhea she does report intermittent epigastric abdominal pain. This is episodic and does drink only occur at night. She has not  noted any heartburn but does note occasional regurgitation. She reports some mild dysphasia as well as early satiety. She has not lost weight.  Of note in the hospital and H. pylori stool antigen was negative. No other stool studies were performed.  Review of Systems  As per history of present illness, otherwise negative   Past Medical History  Diagnosis Date  . Hypertension   . Hypothyroid     multinodular goiter by [place] 2003/02/01  . Duodenal ulcer     At age 30s  . Breast cancer     s/p Left mastectomy 01/31/1997, s/p R radical mastectomy 02-01-1996, lymph nodes negative, in remission  . IBS (irritable bowel syndrome)   . Anal fissure   . Anxiety   . Obesity     Current Outpatient Prescriptions  Medication Sig Dispense Refill  . benazepril (LOTENSIN) 10 MG tablet Take 10 mg by mouth daily. Take along with 20mg  tablet to equal 30mg  dose.      . benazepril (LOTENSIN) 20 MG tablet Take 20 mg by mouth daily. Take along with 10mg  tablet to equal 30mg  dose.      . Coenzyme Q10 (CO Q 10 PO) Take 2 tablets by mouth daily.      Marland Kitchen levothyroxine (SYNTHROID, LEVOTHROID) 100 MCG tablet Take 100 mcg by mouth daily.      . metoprolol succinate (TOPROL-XL) 50 MG 24 hr tablet Take 50 mg by mouth daily. Take with or immediately following a meal.      . hyoscyamine (LEVSIN/SL) 0.125 MG SL tablet Place 1 tablet (0.125 mg total) under the tongue  every 4 (four) hours as needed for cramping.  30 tablet  0  . loperamide (IMODIUM A-D) 2 MG tablet Take 1 tablet (2 mg total) by mouth 4 (four) times daily as needed for diarrhea or loose stools.  30 tablet  0    Allergies  Allergen Reactions  . Codeine Nausea Only  . Sulfa Antibiotics Rash    Childhood allergy.    Family History  Problem Relation Age of Onset  . Heart disease Mother 77    MI   . Hypertension Mother   . Hypertension Father   . Heart disease Father 62    MI  . Cancer Mother     Breast cancer     History  Substance Use Topics  . Smoking status:  Former Smoker -- 1.0 packs/day for 24 years    Quit date: 08/19/1990  . Smokeless tobacco: Never Used  . Alcohol Use: 0.6 oz/week    1 Shots of liquor per week    OBJECTIVE: BP 128/78  Pulse 135  Ht 5\' 1"  (1.549 m)  Wt 282 lb (127.914 kg)  BMI 53.28 kg/m2  SpO2 96% Constitutional: Well-developed and well-nourished. No distress. HEENT: Normocephalic and atraumatic. Oropharynx is clear and moist. No oropharyngeal exudate. Conjunctivae are normal. Pupils are equal round and reactive to light. No scleral icterus. Neck: Neck supple. Trachea midline. Cardiovascular: Normal rate, regular rhythm and intact distal pulses. No M/R/G Pulmonary/chest: Effort normal and breath sounds normal. No wheezing, rales or rhonchi. Abdominal: Soft, obese, nontender, nondistended. Bowel sounds active throughout. There are no masses palpable. No hepatosplenomegaly. Extremities: no clubbing, cyanosis, or edema Lymphadenopathy: No cervical adenopathy noted. Neurological: Alert and oriented to person place and time. Skin: Skin is warm and dry. No rashes noted. Psychiatric: Normal mood and affect. Behavior is normal.  Labs and Imaging -- CBC    Component Value Date/Time   WBC 6.4 02/11/2012 0530   RBC 4.15 02/11/2012 0530   HGB 12.2 02/11/2012 0530   HCT 37.3 02/11/2012 0530   PLT 254 02/11/2012 0530   MCV 89.9 02/11/2012 0530   MCH 29.4 02/11/2012 0530   MCHC 32.7 02/11/2012 0530   RDW 14.0 02/11/2012 0530   LYMPHSABS 1.6 02/08/2012 1045   MONOABS 0.4 02/08/2012 1045   EOSABS 0.0 02/08/2012 1045   BASOSABS 0.0 02/08/2012 1045    CMP     Component Value Date/Time   NA 137 02/11/2012 0530   K 4.1 02/11/2012 0530   CL 104 02/11/2012 0530   CO2 23 02/11/2012 0530   GLUCOSE 94 02/11/2012 0530   BUN 15 02/11/2012 0530   CREATININE 0.63 02/11/2012 0530   CALCIUM 9.2 02/11/2012 0530   PROT 7.6 02/08/2012 1045   ALBUMIN 3.5 02/08/2012 1045   AST 16 02/08/2012 1045   ALT 18 02/08/2012 1045   ALKPHOS 82 02/08/2012 1045    BILITOT 0.4 02/08/2012 1045   GFRNONAA >90 02/11/2012 0530   GFRAA >90 02/11/2012 0530    Lipase     Component Value Date/Time   LIPASE 25 02/08/2012 1045    [place] dated 02/08/12 ABDOMINAL ULTRASOUND COMPLETE   Comparison:  10/15/2007 renal ultrasound.   Findings:   Gallbladder:  The gallbladder is unremarkable. There is no evidence of gallstones, gallbladder wall thickening, or pericholecystic fluid.   Common Bile Duct:  There is no evidence of intrahepatic or extrahepatic biliary dilation. The CBD measures 4.0 mm in greatest diameter.   Liver:  Diffusely increased echogenicity of the liver is compatible with  fatty infiltration.No focal abnormalities are identified.   IVC:  Appears normal.   Pancreas:  Although the pancreas is difficult to visualize in its entirety, no focal pancreatic abnormality is identified.   Spleen:  Within normal limits in size and echotexture.   Right kidney:  The right kidney is normal in size and parenchymal echogenicity.  There is no evidence of solid mass, hydronephrosis or definite renal calculi.  The right kidney measures 11.0 cm.   Left kidney:  The left kidney is normal in size and parenchymal echogenicity.  There is no evidence of solid mass, hydronephrosis or definite renal calculi.   The left kidney measures 11.0 cm.   Abdominal Aorta:  No abdominal aortic aneurysm identified.   There is no evidence of ascites.   IMPRESSION: Fatty infiltration of the liver, otherwise unremarkable abdominal ultrasound.   ASSESSMENT AND PLAN: 61 yo female with PMH of PUD, hypothyroidism, hypertension, breast cancer status post bilateral mastectomy in 1997, IBS, anxiety and obesity who is seen in consultation at the request of Dr. Janalyn Harder for evaluation of epigastric pain and atypical chest pain.  1. Epigastric pain/atypical CP -- the patient did have a recent negative and unrevealing workup from a cardiovascular standpoint. She does have a history  of PUD and is not currently on PPI therapy. She is given a prescription for pantoprazole and sucralfate on discharge from the hospital but she is yet to fill this medication. I recommended upper endoscopy for direct visualization of her upper GI tract. She is having some mild dysphagia and I like to rule out peptic ulcer disease and gastritis as the cause of her ongoing symptoms. I will plan small bowel biopsies given her chronic diarrhea. For now she hold off on PPI plaque endoscopy, but she may in fact need such a medication in the future. I will give her prescription for Levsin to be used as needed for abdominal pain/spasm. She should take this as directed. Also order a celiac panel today to help exclude celiac disease.  2. Diarrhea -- this is a chronic issue for her but does seem to be impacting her life. It seems to respond somewhat to Imodium which she can continue when necessary. I like to get records from her colonoscopy in 2011 to see if random biopsies were done to rule out microscopic colitis. This has not been performed, then she may need flexible sigmoidoscopy for random biopsy. Also perform stool studies to include C. difficile, fecal elastase, ova and parasite, white blood cells, and stool culture.  Further recommendations after the above mentioned workup is complete.

## 2012-03-04 NOTE — Interval H&P Note (Signed)
History and Physical Interval Note:  Symptoms persist without significant interval change. Proceeding to EGD/flex.  03/04/2012 9:35 AM  Kelsey Moore  has presented today for surgery, with the diagnosis of Endo with Flex  The various methods of treatment have been discussed with the patient and family. After consideration of risks, benefits and other options for treatment, the patient has consented to  Procedure(s) (LRB): ESOPHAGOGASTRODUODENOSCOPY (EGD) (N/A) FLEXIBLE SIGMOIDOSCOPY (N/A) as a surgical intervention .  The patient's history has been reviewed, patient examined, no change in status, stable for surgery.  I have reviewed the patients' chart and labs.  Questions were answered to the patient's satisfaction.     [name]

## 2012-03-05 ENCOUNTER — Encounter (HOSPITAL_COMMUNITY): Payer: Self-pay

## 2012-03-05 ENCOUNTER — Other Ambulatory Visit: Payer: Self-pay | Admitting: Gastroenterology

## 2012-03-05 MED ORDER — HYDROCORTISONE ACE-PRAMOXINE 1-1 % RE FOAM: 1.0000 | Freq: Two times a day (BID) | RECTAL | Status: DC

## 2012-03-06 ENCOUNTER — Telehealth: Payer: Self-pay | Admitting: Internal Medicine

## 2012-03-06 ENCOUNTER — Telehealth: Payer: Self-pay | Admitting: *Deleted

## 2012-03-06 MED ORDER — PANCRELIPASE (LIP-PROT-AMYL) 36000-114000 UNITS PO CPEP: 36000.0000 [IU] | ORAL_CAPSULE | ORAL | Status: DC

## 2012-03-06 NOTE — Telephone Encounter (Signed)
Pt walked in our office c/o luq pain since the procedures; ECL on 03/04/12. She started the Pantoprazole this am. Asked pt to wait until I could speak with Dr Rhea Belton about her path report. Per Dr Rhea Belton, begin Creon for Pancreatic Insufficiency per script; he feels this may be the cause of her diarrhea. Her Biopsy/Path report shows she has mild inflammation, but no infection in her stomach: no Eosinophillc Esophagitis or Microscopic Colitis. Her polyp was not precancerous. She may use Mylanta PRN and she will f/u on 04/08/12 at 11am. Pt given information.

## 2012-03-06 NOTE — Telephone Encounter (Signed)
Pt just wanted [place] to know they don't make Mylanta anymore and thanked [place] for all our help this am.

## 2012-03-09 ENCOUNTER — Encounter (HOSPITAL_COMMUNITY): Payer: Self-pay | Admitting: Internal Medicine

## 2012-03-10 ENCOUNTER — Telehealth: Payer: Self-pay | Admitting: Internal Medicine

## 2012-03-10 NOTE — Telephone Encounter (Signed)
lmom for pt to call back

## 2012-03-11 ENCOUNTER — Encounter: Payer: Self-pay | Admitting: Internal Medicine

## 2012-03-11 DIAGNOSIS — Z9889 Other specified postprocedural states: Secondary | ICD-10-CM | POA: Insufficient documentation

## 2012-03-12 ENCOUNTER — Encounter: Payer: Self-pay | Admitting: Internal Medicine

## 2012-03-12 NOTE — Telephone Encounter (Signed)
Pt never called back.

## 2012-03-13 ENCOUNTER — Other Ambulatory Visit: Payer: Self-pay | Admitting: Internal Medicine

## 2012-03-13 MED ORDER — PANCRELIPASE (LIP-PROT-AMYL) 36000-114000 UNITS PO CPEP: 36000.0000 [IU] | ORAL_CAPSULE | ORAL | Status: DC

## 2012-03-13 NOTE — Telephone Encounter (Signed)
Prescription sent to the pharmacy.

## 2012-03-18 ENCOUNTER — Telehealth: Payer: Self-pay | Admitting: *Deleted

## 2012-03-18 ENCOUNTER — Ambulatory Visit: Payer: 59 | Admitting: Nurse Practitioner

## 2012-03-18 NOTE — Telephone Encounter (Signed)
Pt walked into office today stating her boss sent her and wants something done for her.  Pt reports she has vomiting, dry heaves and her diarrhea has increased. I asked the pt why she did not return my call and asked if all the meds given to her had helped. Pt got upset and stated she got worse last Thursday and she doesn't know if the meds helped or not. She requested to be seen today by Dr Rhea Belton and I explained he doesn't have anything, but offered her an appt with Willette Cluster, NP in one hour. She wanted to know  if she could see Dr Rhea Belton earlier than the 04/08/12 appt. I came back after looking and told her her was on vacation for a week and then in the hospital for a week, so no. I explained Gunnar Fusi would confer with him, but she got upset and stated she would deal with it. She stated she was sorry she was bothering me and I explained she wasn't bothering me, but she needs to call and can't walk in the office for advice. She stated she wasn't going to see Gunnar Fusi and I asked her to go to the ER if she didn't want to be seen. Pt left and the appt was cancelled. She was rubbing her r side above her waist at times; informed Dr Rhea Belton.

## 2012-04-07 ENCOUNTER — Encounter: Payer: Self-pay | Admitting: Internal Medicine

## 2012-04-08 ENCOUNTER — Encounter: Payer: Self-pay | Admitting: Internal Medicine

## 2012-04-08 ENCOUNTER — Ambulatory Visit (INDEPENDENT_AMBULATORY_CARE_PROVIDER_SITE_OTHER): Payer: 59 | Admitting: Internal Medicine

## 2012-04-08 VITALS — BP 128/74 | HR 98 | Ht 61.0 in | Wt 258.0 lb

## 2012-04-08 DIAGNOSIS — K8689 Other specified diseases of pancreas: Secondary | ICD-10-CM | POA: Insufficient documentation

## 2012-04-08 DIAGNOSIS — K269 Duodenal ulcer, unspecified as acute or chronic, without hemorrhage or perforation: Secondary | ICD-10-CM

## 2012-04-08 DIAGNOSIS — R197 Diarrhea, unspecified: Secondary | ICD-10-CM

## 2012-04-08 MED ORDER — PANCRELIPASE (LIP-PROT-AMYL) 25000 UNITS PO CPEP: 1.0000 | ORAL_CAPSULE | Freq: Three times a day (TID) | ORAL | Status: DC

## 2012-04-08 NOTE — Patient Instructions (Addendum)
You have been scheduled for a CT scan of the abdomen and pelvis at [facility], Located at [address] [place]  You are scheduled on 04/09/2012 at 9:30am. You should arrive 15 minutes prior to your appointment time for registration. Please follow the written instructions below on the day of your exam:  WARNING: IF YOU ARE ALLERGIC TO IODINE/X-RAY DYE, PLEASE NOTIFY RADIOLOGY IMMEDIATELY AT [phone]! YOU WILL BE GIVEN A 13 HOUR PREMEDICATION PREP.  1) Do not eat or drink anything after 1:30pm (4 hours prior to your test) 2) You have been given 2 bottles of oral contrast to drink. The solution may taste better if refrigerated, but do NOT add ice or any other liquid to this solution. Shake well before drinking.    Drink 1 bottle of contrast @ 3:30 (2 hours prior to your exam)  Drink 1 bottle of contrast @ 4:30 (1 hour prior to your exam)  You may take any medications as prescribed with a small amount of water except for the following: Metformin, Glucophage, Glucovance, Avandamet, Riomet, Fortamet, Actoplus Met, Janumet, Glumetza or Metaglip. The above medications must be held the day of the exam AND 48 hours after the exam.  The purpose of you drinking the oral contrast is to aid in the visualization of your intestinal tract. The contrast solution may cause some diarrhea. Before your exam is started, you will be given a small amount of fluid to drink. Depending on your individual set of symptoms, you may also receive an intravenous injection of x-ray contrast/dye.  If you have any questions regarding your exam or if you need to reschedule, you may call the CT department at [phone] between the hours of 8:00 am and 5:00 pm, Monday-Friday.  ________________________________________________________________________    ________________________________________________________________________

## 2012-04-08 NOTE — Progress Notes (Signed)
Subjective:    Patient ID: Kelsey Moore, female    DOB: 25-May-1951, 61 y.o.   MRN: [id number]  HPI Kelsey Moore is a 61 yo female with PMH of PUD, hypothyroidism, hypertension, breast cancer status post bilateral mastectomy in 1997, IBS, anxiety and obesity who is seen in followup. She underwent upper endoscopy with flexible sigmoidoscopy on 03/04/2012. Upper endoscopy revealed mild antral gastritis and a duodenal bulb ulcer. Biopsies were negative for H. pylori, metaplasia or dysplasia. Her flexible sigmoidoscopy revealed normal appearing colonic mucosa and mild diverticulosis. There was a sessile polyp in the rectum which was a lymphoid polyp. Random biopsies were negative for microscopic colitis. Also the workup stool studies were performed and revealed a low fecal elastase consistent with pancreatic insufficiency. With this in mind she was started on a trial of Creon 2 tablets with meals and one with snacks. She reports she tolerated this drug for one week but then developed severe upper abdominal cramping pain with nausea and vomiting. She was convinced she was "dying". The medication was discontinued at her symptoms resolved after about 72 hours. Today she reports her epigastric and upper abdominal pain is better. She still has very mild but regular nausea. No vomiting. She is able to eat and she's eating more healthfully and trying to eat smaller meals. Her diarrhea continues and his multiple times daily, but often not large volume. She's had no blood or melena. The diarrhea does not wake her from sleep, but requires multiple trips to the bathroom throughout the day. She continues on pantoprazole 40 mg daily and is tolerating this well.  Review of Systems As per history of present illness, otherwise negative  Current Medications, Allergies, Past Medical History, Past Surgical History, Family History and Social History were reviewed in Kelsey Moore record.      Objective:   Physical Exam BP 128/74  Pulse 98  Ht 5\' 1"  (1.549 m)  Wt 258 lb (117.028 kg)  BMI 48.75 kg/m2 Constitutional: Well-developed and well-nourished. No distress. HEENT: Normocephalic and atraumatic. Oropharynx is clear and moist. No oropharyngeal exudate. Conjunctivae are normal.  No scleral icterus. Neck: Neck supple. Trachea midline. Cardiovascular: Normal rate, regular rhythm. No M/R/G Pulmonary/chest: Effort normal and breath sounds normal. No wheezing, rales or rhonchi. Abdominal: Soft, obese, nontender. Bowel sounds active throughout.  Extremities: no clubbing, cyanosis, or edema Lymphadenopathy: No cervical adenopathy noted. Neurological: Alert and oriented to person place and time. Skin: Skin is warm and dry. No rashes noted. Psychiatric: Normal mood and affect. Behavior is normal.  CMP     Component Value Date/Time   NA 137 02/11/2012 0530   K 4.1 02/11/2012 0530   CL 104 02/11/2012 0530   CO2 23 02/11/2012 0530   GLUCOSE 94 02/11/2012 0530   BUN 15 02/11/2012 0530   CREATININE 0.63 02/11/2012 0530   CALCIUM 9.2 02/11/2012 0530   PROT 7.6 02/08/2012 1045   ALBUMIN 3.5 02/08/2012 1045   AST 16 02/08/2012 1045   ALT 18 02/08/2012 1045   ALKPHOS 82 02/08/2012 1045   BILITOT 0.4 02/08/2012 1045   GFRNONAA >90 02/11/2012 0530   GFRAA >90 02/11/2012 0530    Lipase     Component Value Date/Time   LIPASE 25 02/08/2012 1045   CBC    Component Value Date/Time   WBC 6.4 02/11/2012 0530   RBC 4.15 02/11/2012 0530   HGB 12.2 02/11/2012 0530   HCT 37.3 02/11/2012 0530   PLT 254 02/11/2012 0530   MCV 89.9  02/11/2012 0530   MCH 29.4 02/11/2012 0530   MCHC 32.7 02/11/2012 0530   RDW 14.0 02/11/2012 0530   LYMPHSABS 1.6 02/08/2012 1045   MONOABS 0.4 02/08/2012 1045   EOSABS 0.0 02/08/2012 1045   BASOSABS 0.0 02/08/2012 1045      Assessment & Plan:  61 yo female with PMH of PUD, hypothyroidism, hypertension, breast cancer status post bilateral mastectomy in 1997, IBS, anxiety and obesity who is  seen in followup.  1. Chronic diarrhea/pancreatitc insufficiency -- the patient's chronic diarrhea has been evaluated extensively and she did have full colonoscopy in 2011, also with random biopsies which were negative for microscopic colitis. Her other stool studies were negative, but the fecal elastase test suggests an element of pancreatic insufficiency. She did not tolerate Creon, but I do think pancreatic enzyme replacement may improve her diarrhea.  We will give a trial of Zenpep 1 tablet 3 times a day with meals, as a starting point. This is under dosing, but given her intolerance to Creon I would like to start low and work up.  If she tolerates this dose, I've asked that she increase to 2 tablets 3 times a day with meals after one week. I also recommend cross-sectional imaging of her abdomen to evaluate her pancreas specifically for any evidence of chronic pancreatitis or pancreatic mass given her insufficiency. Time was spent today discussing her diet and she is agreeable to this plan.  2. Duodenal ulcer disease/dyspepsia -- she does not have H. pylori disease and has avoided NSAIDs. She is tolerating the pantoprazole well and is having improvement in her upper GI symptoms. I would like for her to continue daily pantoprazole for now and continue to avoid NSAIDs  She will return in 3 months time or sooner if necessary

## 2012-04-09 ENCOUNTER — Other Ambulatory Visit: Payer: Self-pay | Admitting: Gastroenterology

## 2012-04-09 ENCOUNTER — Ambulatory Visit
Admission: RE | Admit: 2012-04-09 | Discharge: 2012-04-09 | Disposition: A | Discharge status: Home / Self Care | Payer: 59 | Source: Ambulatory Visit | Attending: Internal Medicine | Admitting: Internal Medicine

## 2012-04-09 DIAGNOSIS — R197 Diarrhea, unspecified: Secondary | ICD-10-CM

## 2012-04-09 MED ORDER — IOHEXOL 350 MG/ML SOLN
125.0000 mL | Freq: Once | INTRAVENOUS | Status: AC | PRN
  Administered 2012-04-09: 125 mL via INTRAVENOUS

## 2012-04-14 ENCOUNTER — Telehealth: Payer: Self-pay | Admitting: *Deleted

## 2012-04-14 NOTE — Telephone Encounter (Signed)
Message copied by Florene Glen on Tue Apr 14, 2012  4:48 PM ------      Message from: Beverley Fiedler      Created: Mon Apr 13, 2012  1:23 PM       CT reviewed. Pancreas appears normal. Incidentally a small adrenal lesion was seen, this is likely benign, but I recommend repeating the limited views CT noncontrast of this adrenal lesion in 6 months for followup      She should continue on the lipase replacement, Zenpep, and followup as discussed

## 2012-04-14 NOTE — Telephone Encounter (Signed)
Pt called in for results and I started ready the notes from Dr Rhea Belton and then started reading the actual CT report to her and she started yelling at me stating, " Save your breath! I've had cancer twice and 3 times you're out! Send me the report!". Informed pt I will mail her the report-done.

## 2012-05-27 ENCOUNTER — Other Ambulatory Visit: Payer: Self-pay | Admitting: Endocrinology

## 2012-05-27 ENCOUNTER — Ambulatory Visit
Admission: RE | Admit: 2012-05-27 | Discharge: 2012-05-27 | Disposition: A | Discharge status: Home / Self Care | Payer: 59 | Source: Ambulatory Visit | Attending: Endocrinology | Admitting: Endocrinology

## 2012-05-27 DIAGNOSIS — E049 Nontoxic goiter, unspecified: Secondary | ICD-10-CM

## 2012-06-01 ENCOUNTER — Other Ambulatory Visit: Payer: Self-pay | Admitting: Endocrinology

## 2012-06-01 DIAGNOSIS — E042 Nontoxic multinodular goiter: Secondary | ICD-10-CM

## 2012-06-04 ENCOUNTER — Other Ambulatory Visit (HOSPITAL_COMMUNITY)
Admission: RE | Admit: 2012-06-04 | Discharge: 2012-06-04 | Disposition: A | Discharge status: Home / Self Care | Payer: 59 | Source: Ambulatory Visit | Attending: Interventional Radiology | Admitting: Interventional Radiology

## 2012-06-04 ENCOUNTER — Ambulatory Visit
Admission: RE | Admit: 2012-06-04 | Discharge: 2012-06-04 | Disposition: A | Discharge status: Home / Self Care | Payer: 59 | Source: Ambulatory Visit | Attending: Endocrinology | Admitting: Endocrinology

## 2012-06-04 VITALS — BP 151/68 | HR 84

## 2012-06-04 DIAGNOSIS — E042 Nontoxic multinodular goiter: Secondary | ICD-10-CM

## 2012-06-04 DIAGNOSIS — E049 Nontoxic goiter, unspecified: Secondary | ICD-10-CM | POA: Insufficient documentation

## 2012-10-22 ENCOUNTER — Other Ambulatory Visit: Payer: Self-pay | Admitting: Endocrinology

## 2012-11-13 ENCOUNTER — Other Ambulatory Visit: Payer: 59

## 2012-11-30 ENCOUNTER — Other Ambulatory Visit: Payer: 59

## 2013-10-01 ENCOUNTER — Encounter: Payer: Self-pay | Admitting: Internal Medicine

## 2013-10-01 ENCOUNTER — Ambulatory Visit (INDEPENDENT_AMBULATORY_CARE_PROVIDER_SITE_OTHER)
Admission: RE | Admit: 2013-10-01 | Discharge: 2013-10-01 | Disposition: A | Discharge status: Home / Self Care | Payer: 59 | Source: Ambulatory Visit | Attending: Internal Medicine | Admitting: Internal Medicine

## 2013-10-01 ENCOUNTER — Ambulatory Visit (INDEPENDENT_AMBULATORY_CARE_PROVIDER_SITE_OTHER): Payer: 59 | Admitting: Internal Medicine

## 2013-10-01 ENCOUNTER — Other Ambulatory Visit (INDEPENDENT_AMBULATORY_CARE_PROVIDER_SITE_OTHER): Payer: 59

## 2013-10-01 VITALS — BP 122/68 | HR 103 | Temp 98.2°F | Resp 16 | Ht 61.0 in | Wt 289.0 lb

## 2013-10-01 DIAGNOSIS — F418 Other specified anxiety disorders: Secondary | ICD-10-CM | POA: Insufficient documentation

## 2013-10-01 DIAGNOSIS — M25559 Pain in unspecified hip: Secondary | ICD-10-CM

## 2013-10-01 DIAGNOSIS — M25551 Pain in right hip: Secondary | ICD-10-CM

## 2013-10-01 DIAGNOSIS — E039 Hypothyroidism, unspecified: Secondary | ICD-10-CM

## 2013-10-01 DIAGNOSIS — E279 Disorder of adrenal gland, unspecified: Secondary | ICD-10-CM

## 2013-10-01 DIAGNOSIS — E278 Other specified disorders of adrenal gland: Secondary | ICD-10-CM | POA: Insufficient documentation

## 2013-10-01 DIAGNOSIS — I10 Essential (primary) hypertension: Secondary | ICD-10-CM

## 2013-10-01 DIAGNOSIS — M5430 Sciatica, unspecified side: Secondary | ICD-10-CM

## 2013-10-01 DIAGNOSIS — K269 Duodenal ulcer, unspecified as acute or chronic, without hemorrhage or perforation: Secondary | ICD-10-CM

## 2013-10-01 DIAGNOSIS — M169 Osteoarthritis of hip, unspecified: Secondary | ICD-10-CM | POA: Insufficient documentation

## 2013-10-01 DIAGNOSIS — F341 Dysthymic disorder: Secondary | ICD-10-CM

## 2013-10-01 DIAGNOSIS — M5431 Sciatica, right side: Secondary | ICD-10-CM | POA: Insufficient documentation

## 2013-10-01 DIAGNOSIS — K602 Anal fissure, unspecified: Secondary | ICD-10-CM | POA: Insufficient documentation

## 2013-10-01 LAB — URINALYSIS, ROUTINE W REFLEX MICROSCOPIC
BILIRUBIN URINE: NEGATIVE
Hgb urine dipstick: NEGATIVE
Ketones, ur: NEGATIVE
NITRITE: NEGATIVE
PH: 5.5 (ref 5.0–8.0)
RBC / HPF: NONE SEEN (ref 0–?)
Specific Gravity, Urine: 1.025 (ref 1.000–1.030)
Total Protein, Urine: NEGATIVE
UROBILINOGEN UA: 0.2 (ref 0.0–1.0)
Urine Glucose: NEGATIVE

## 2013-10-01 LAB — CBC WITH DIFFERENTIAL/PLATELET
BASOS PCT: 0.4 % (ref 0.0–3.0)
Basophils Absolute: 0 10*3/uL (ref 0.0–0.1)
EOS PCT: 1.4 % (ref 0.0–5.0)
Eosinophils Absolute: 0.1 10*3/uL (ref 0.0–0.7)
HEMATOCRIT: 38.8 % (ref 36.0–46.0)
HEMOGLOBIN: 12.6 g/dL (ref 12.0–15.0)
LYMPHS ABS: 2.3 10*3/uL (ref 0.7–4.0)
Lymphocytes Relative: 26.4 % (ref 12.0–46.0)
MCHC: 32.6 g/dL (ref 30.0–36.0)
MCV: 89.3 fl (ref 78.0–100.0)
MONO ABS: 0.7 10*3/uL (ref 0.1–1.0)
Monocytes Relative: 7.5 % (ref 3.0–12.0)
Neutro Abs: 5.7 10*3/uL (ref 1.4–7.7)
Neutrophils Relative %: 64.3 % (ref 43.0–77.0)
Platelets: 314 10*3/uL (ref 150.0–400.0)
RBC: 4.35 Mil/uL (ref 3.87–5.11)
RDW: 13.4 % (ref 11.5–14.6)
WBC: 8.9 10*3/uL (ref 4.5–10.5)

## 2013-10-01 LAB — COMPREHENSIVE METABOLIC PANEL
ALBUMIN: 3.3 g/dL — AB (ref 3.5–5.2)
ALK PHOS: 80 U/L (ref 39–117)
ALT: 20 U/L (ref 0–35)
AST: 18 U/L (ref 0–37)
BILIRUBIN TOTAL: 0.6 mg/dL (ref 0.3–1.2)
BUN: 18 mg/dL (ref 6–23)
CO2: 23 mEq/L (ref 19–32)
Calcium: 9.5 mg/dL (ref 8.4–10.5)
Chloride: 104 mEq/L (ref 96–112)
Creatinine, Ser: 0.6 mg/dL (ref 0.4–1.2)
GFR: 118.12 mL/min (ref >60.00)
GLUCOSE: 86 mg/dL (ref 70–99)
Potassium: 3.9 mEq/L (ref 3.5–5.1)
Sodium: 138 mEq/L (ref 135–145)
Total Protein: 7.2 g/dL (ref 6.0–8.3)

## 2013-10-01 LAB — LIPID PANEL
CHOL/HDL RATIO: 4
Cholesterol: 175 mg/dL (ref 0–200)
HDL: 49.3 mg/dL (ref >39.00)
LDL CALC: 100 mg/dL — AB (ref 0–99)
TRIGLYCERIDES: 130 mg/dL (ref 0.0–149.0)
VLDL: 26 mg/dL (ref 0.0–40.0)

## 2013-10-01 LAB — TSH: TSH: 0.03 u[IU]/mL — AB (ref 0.35–5.50)

## 2013-10-01 MED ORDER — VILAZODONE HCL 10 & 20 & 40 MG PO KIT: 1.0000 | PACK | Freq: Every day | ORAL | Status: DC

## 2013-10-01 MED ORDER — TRAMADOL HCL 50 MG PO TABS: 50.0000 mg | ORAL_TABLET | Freq: Three times a day (TID) | ORAL | Status: DC | PRN

## 2013-10-01 MED ORDER — DEXLANSOPRAZOLE 60 MG PO CPDR: 60.0000 mg | DELAYED_RELEASE_CAPSULE | Freq: Every day | ORAL | Status: AC

## 2013-10-01 NOTE — Progress Notes (Signed)
Subjective:    Patient ID: Kelsey Moore, female    DOB: June 03, 1951, 63 y.o.   MRN: [id number]  Hip Pain  Incident onset: no injury, just pain for 1-2 yesrs. There was no injury mechanism. The pain is present in the right hip. The quality of the pain is described as aching. The pain is at a severity of 3/10. The pain is mild. The pain has been worsening since onset. Pertinent negatives include no inability to bear weight, loss of motion, loss of sensation, muscle weakness, numbness or tingling. She reports no foreign bodies present. She has tried NSAIDs for the symptoms. The treatment provided mild relief.      Review of Systems  Constitutional: Positive for fatigue and unexpected weight change (wt gain). Negative for fever, chills, diaphoresis, activity change and appetite change.  HENT: Negative.   Eyes: Negative.   Respiratory: Negative.  Negative for cough, choking, chest tightness, shortness of breath, wheezing and stridor.   Cardiovascular: Negative.  Negative for chest pain, palpitations and leg swelling.  Gastrointestinal: Positive for abdominal pain, anal bleeding and rectal pain. Negative for nausea, vomiting, diarrhea, constipation, blood in stool and abdominal distention.  Endocrine: Negative.   Genitourinary: Negative.   Musculoskeletal: Positive for arthralgias. Negative for back pain, gait problem, joint swelling, myalgias, neck pain and neck stiffness.  Skin: Negative.   Allergic/Immunologic: Negative.   Neurological: Negative.  Negative for dizziness, tingling, tremors, speech difficulty, weakness, light-headedness, numbness and headaches.  Hematological: Negative.  Negative for adenopathy. Does not bruise/bleed easily.  Psychiatric/Behavioral: Positive for dysphoric mood. Negative for suicidal ideas, hallucinations, behavioral problems, confusion, sleep disturbance, self-injury, decreased concentration and agitation. The patient is nervous/anxious. The patient is not  hyperactive.        Objective:   Physical Exam  Vitals reviewed. Constitutional: She is oriented to person, place, and time. She appears well-developed and well-nourished.  Non-toxic appearance. She does not have a sickly appearance. She does not appear ill. No distress.  HENT:  Head: Normocephalic and atraumatic.  Mouth/Throat: Oropharynx is clear and moist. No oropharyngeal exudate.  Eyes: Conjunctivae are normal. Right eye exhibits no discharge. Left eye exhibits no discharge. No scleral icterus.  Neck: Normal range of motion. Neck supple. No JVD present. No tracheal deviation present. No thyromegaly present.  Cardiovascular: Normal rate, regular rhythm, normal heart sounds and intact distal pulses.  Exam reveals no gallop and no friction rub.   No murmur heard. Pulmonary/Chest: Effort normal and breath sounds normal. No stridor. No respiratory distress. She has no wheezes. She has no rales. She exhibits no tenderness.  Abdominal: Soft. Normal appearance and bowel sounds are normal. She exhibits no shifting dullness, no distension, no pulsatile liver, no fluid wave, no abdominal bruit, no ascites, no pulsatile midline mass and no mass. There is no hepatosplenomegaly, splenomegaly or hepatomegaly. There is no tenderness. There is no rigidity, no rebound, no guarding, no CVA tenderness, no tenderness at McBurney's point and negative Murphy's sign. No hernia. Hernia confirmed negative in the ventral area, confirmed negative in the right inguinal area and confirmed negative in the left inguinal area.  Genitourinary:  She refused to undress for a rectal exam  Musculoskeletal: Normal range of motion. She exhibits no edema and no tenderness.       Right hip: Normal. She exhibits normal range of motion, normal strength, no tenderness, no bony tenderness, no swelling, no crepitus, no deformity and no laceration.  Lymphadenopathy:    She has no cervical adenopathy.  Neurological: She is oriented to  person, place, and time.  Skin: Skin is warm and dry. No rash noted. She is not diaphoretic. No erythema. No pallor.  Psychiatric: She has a normal mood and affect. Her behavior is normal. Judgment and thought content normal.     Lab Results  Component Value Date   WBC 6.4 02/11/2012   HGB 12.2 02/11/2012   HCT 37.3 02/11/2012   PLT 254 02/11/2012   GLUCOSE 94 02/11/2012   CHOL 181 02/08/2012   TRIG 131 02/08/2012   HDL 57 02/08/2012   LDLCALC 98 02/08/2012   ALT 18 02/08/2012   AST 16 02/08/2012   NA 137 02/11/2012   K 4.1 02/11/2012   CL 104 02/11/2012   CREATININE 0.63 02/11/2012   BUN 15 02/11/2012   CO2 23 02/11/2012   TSH 0.426 02/08/2012   HGBA1C 5.8* 02/08/2012       Assessment & Plan:

## 2013-10-01 NOTE — Assessment & Plan Note (Signed)
I have asked her to see PT about this

## 2013-10-01 NOTE — Assessment & Plan Note (Signed)
She still a lot of s/s despite prozac I have asked her to transition to viibryd

## 2013-10-01 NOTE — Patient Instructions (Signed)
Sciatica °Sciatica is pain, weakness, numbness, or tingling along the path of the sciatic nerve. The nerve starts in the lower back and runs down the back of each leg. The nerve controls the muscles in the lower leg and in the back of the knee, while also providing sensation to the back of the thigh, lower leg, and the sole of your foot. Sciatica is a symptom of another medical condition. For instance, nerve damage or certain conditions, such as a herniated disk or bone spur on the spine, pinch or put pressure on the sciatic nerve. This causes the pain, weakness, or other sensations normally associated with sciatica. Generally, sciatica only affects one side of the body. °CAUSES  °· Herniated or slipped disc. °· Degenerative disk disease. °· A pain disorder involving the narrow muscle in the buttocks (piriformis syndrome). °· Pelvic injury or fracture. °· Pregnancy. °· Tumor (rare). °SYMPTOMS  °Symptoms can vary from mild to very severe. The symptoms usually travel from the low back to the buttocks and down the back of the leg. Symptoms can include: °· Mild tingling or dull aches in the lower back, leg, or hip. °· Numbness in the back of the calf or sole of the foot. °· Burning sensations in the lower back, leg, or hip. °· Sharp pains in the lower back, leg, or hip. °· Leg weakness. °· Severe back pain inhibiting movement. °These symptoms may get worse with coughing, sneezing, laughing, or prolonged sitting or standing. Also, being overweight may worsen symptoms. °DIAGNOSIS  °Your caregiver will perform a physical exam to look for common symptoms of sciatica. He or she may ask you to do certain movements or activities that would trigger sciatic nerve pain. Other tests may be performed to find the cause of the sciatica. These may include: °· Blood tests. °· X-rays. °· Imaging tests, such as an MRI or CT scan. °TREATMENT  °Treatment is directed at the cause of the sciatic pain. Sometimes, treatment is not necessary  and the pain and discomfort goes away on its own. If treatment is needed, your caregiver may suggest: °· Over-the-counter medicines to relieve pain. °· Prescription medicines, such as anti-inflammatory medicine, muscle relaxants, or narcotics. °· Applying heat or ice to the painful area. °· Steroid injections to lessen pain, irritation, and inflammation around the nerve. °· Reducing activity during periods of pain. °· Exercising and stretching to strengthen your abdomen and improve flexibility of your spine. Your caregiver may suggest losing weight if the extra weight makes the back pain worse. °· Physical therapy. °· Surgery to eliminate what is pressing or pinching the nerve, such as a bone spur or part of a herniated disk. °HOME CARE INSTRUCTIONS  °· Only take over-the-counter or prescription medicines for pain or discomfort as directed by your caregiver. °· Apply ice to the affected area for 20 minutes, 3 4 times a day for the first 48 72 hours. Then try heat in the same way. °· Exercise, stretch, or perform your usual activities if these do not aggravate your pain. °· Attend physical therapy sessions as directed by your caregiver. °· Keep all follow-up appointments as directed by your caregiver. °· Do not wear high heels or shoes that do not provide proper support. °· Check your mattress to see if it is too soft. A firm mattress may lessen your pain and discomfort. °SEEK IMMEDIATE MEDICAL CARE IF:  °· You lose control of your bowel or bladder (incontinence). °· You have increasing weakness in the lower back,   pelvis, buttocks, or legs. °· You have redness or swelling of your back. °· You have a burning sensation when you urinate. °· You have pain that gets worse when you lie down or awakens you at night. °· Your pain is worse than you have experienced in the past. °· Your pain is lasting longer than 4 weeks. °· You are suddenly losing weight without reason. °MAKE SURE YOU: °· Understand these  instructions. °· Will watch your condition. °· Will get help right away if you are not doing well or get worse. °Document Released: 07/30/2001 Document Revised: 02/04/2012 Document Reviewed: 12/15/2011 °[employer]® Patient Information ©2014 [employer]. ° °

## 2013-10-01 NOTE — Assessment & Plan Note (Signed)
Plain film shows DJD She will stop the nsaids and will try tramadol for the pain

## 2013-10-01 NOTE — Assessment & Plan Note (Signed)
I will recheck her TSH today and will adjust her dose if needed 

## 2013-10-01 NOTE — Assessment & Plan Note (Signed)
She will stop taking nsaids and will start dexilant I will check her CBC today to see if she has developed an anemia

## 2013-10-01 NOTE — Assessment & Plan Note (Signed)
GS referral 

## 2013-10-02 ENCOUNTER — Encounter: Payer: Self-pay | Admitting: Internal Medicine

## 2013-10-02 NOTE — Assessment & Plan Note (Signed)
Will repeat the CT scan to see if this is stable

## 2013-10-14 ENCOUNTER — Ambulatory Visit (INDEPENDENT_AMBULATORY_CARE_PROVIDER_SITE_OTHER): Payer: 59 | Admitting: Surgery

## 2013-10-15 ENCOUNTER — Telehealth: Payer: Self-pay | Admitting: Internal Medicine

## 2013-10-15 ENCOUNTER — Telehealth: Payer: Self-pay

## 2013-10-15 NOTE — Telephone Encounter (Signed)
Rose notified

## 2013-10-15 NOTE — Telephone Encounter (Signed)
SHE ASKED ME TO ORDER THIS TO FOLLOW UP ON HER ADRENAL NODULE

## 2013-10-15 NOTE — Telephone Encounter (Signed)
yes

## 2013-10-15 NOTE — Telephone Encounter (Signed)
I called pt to inform of her [place] that is scheduled for    10-20-2013 3:00 pt was upset states that Dr did not tell her she had to have this procedure done pt refused appt . Also rose @ct  states this order need to be without contrast , pt states she will call the office and speak with his assistance  To see why he order this test .

## 2013-10-15 NOTE — Telephone Encounter (Signed)
Rose from [place] called [facility] stating that MD order a CT abdomen/pelvis with contrast. She states that radiologist recommends CT abdomen w/o contrast. Please advise if ok to change. Thanks

## 2013-10-15 NOTE — Telephone Encounter (Signed)
Patient also phoned triage line wanting to know why new tests were being ordered when she didn't know the results of the previous tests.  Phoned patient back to explain (after reviewing previously documented phone conversation with [facility]).  Was in the process of explaining test results letter (composed 10/02/13) to patient when call was either dropped or she hung up.  Attempted 3 times to call back & went straight to voicemail.

## 2013-10-18 ENCOUNTER — Telehealth: Payer: Self-pay | Admitting: *Deleted

## 2013-10-18 DIAGNOSIS — F418 Other specified anxiety disorders: Secondary | ICD-10-CM

## 2013-10-18 MED ORDER — VILAZODONE HCL 40 MG PO TABS: 40.0000 mg | ORAL_TABLET | Freq: Every day | ORAL | Status: DC

## 2013-10-18 NOTE — Telephone Encounter (Signed)
Patient phoned requesting a refill for her vilazodone-states she has not had any side fxs from the starter kit.  Please advise.

## 2013-10-18 NOTE — Telephone Encounter (Signed)
Phoned & notified patient script had been sent to local pharmacy.

## 2013-10-18 NOTE — Telephone Encounter (Signed)
Rx was sent to her pharmacy.

## 2013-10-20 ENCOUNTER — Other Ambulatory Visit: Payer: 59

## 2013-10-21 ENCOUNTER — Ambulatory Visit (INDEPENDENT_AMBULATORY_CARE_PROVIDER_SITE_OTHER): Payer: 59 | Admitting: Surgery

## 2013-10-21 ENCOUNTER — Other Ambulatory Visit: Payer: 59

## 2013-10-22 ENCOUNTER — Other Ambulatory Visit: Payer: Self-pay | Admitting: *Deleted

## 2013-10-22 MED ORDER — LOSARTAN POTASSIUM-HCTZ 100-12.5 MG PO TABS: 1.0000 | ORAL_TABLET | Freq: Every day | ORAL | Status: DC

## 2013-11-22 ENCOUNTER — Encounter: Payer: Self-pay | Admitting: Internal Medicine

## 2013-11-22 ENCOUNTER — Other Ambulatory Visit (INDEPENDENT_AMBULATORY_CARE_PROVIDER_SITE_OTHER): Payer: 59

## 2013-11-22 ENCOUNTER — Ambulatory Visit (INDEPENDENT_AMBULATORY_CARE_PROVIDER_SITE_OTHER): Payer: 59 | Admitting: Internal Medicine

## 2013-11-22 VITALS — BP 120/72 | HR 72 | Temp 98.2°F | Resp 16 | Ht 61.0 in | Wt 287.0 lb

## 2013-11-22 DIAGNOSIS — M25559 Pain in unspecified hip: Secondary | ICD-10-CM

## 2013-11-22 DIAGNOSIS — I10 Essential (primary) hypertension: Secondary | ICD-10-CM

## 2013-11-22 DIAGNOSIS — M25551 Pain in right hip: Secondary | ICD-10-CM

## 2013-11-22 DIAGNOSIS — E039 Hypothyroidism, unspecified: Secondary | ICD-10-CM

## 2013-11-22 LAB — BASIC METABOLIC PANEL
BUN: 18 mg/dL (ref 6–23)
CALCIUM: 9.1 mg/dL (ref 8.4–10.5)
CO2: 25 mEq/L (ref 19–32)
CREATININE: 0.6 mg/dL (ref 0.4–1.2)
Chloride: 103 mEq/L (ref 96–112)
GFR: 102.83 mL/min (ref >60.00)
GLUCOSE: 149 mg/dL — AB (ref 70–99)
POTASSIUM: 3.7 meq/L (ref 3.5–5.1)
Sodium: 139 mEq/L (ref 135–145)

## 2013-11-22 LAB — MAGNESIUM: Magnesium: 2 mg/dL (ref 1.5–2.5)

## 2013-11-22 MED ORDER — THYROID 81.25 MG PO TABS: 1.0000 | ORAL_TABLET | Freq: Two times a day (BID) | ORAL | Status: DC

## 2013-11-22 NOTE — Assessment & Plan Note (Signed)
Her last TSH was low so I have lowered the dose on her nature-throid

## 2013-11-22 NOTE — Assessment & Plan Note (Signed)
Ortho referral  

## 2013-11-22 NOTE — Assessment & Plan Note (Signed)
At her request, I will recheck her Mg++ level today

## 2013-11-22 NOTE — Assessment & Plan Note (Signed)
Her BP is well controlled I will check her lytes and renal function 

## 2013-11-22 NOTE — Progress Notes (Signed)
   Subjective:    Patient ID: Kelsey Moore, female    DOB: 1951-04-09, 63 y.o.   MRN: [id number]  Hypertension This is a chronic problem. The current episode started more than 1 year ago. The problem is controlled. Pertinent negatives include no anxiety, blurred vision, chest pain, headaches, malaise/fatigue, neck pain, orthopnea, palpitations, peripheral edema, PND, shortness of breath or sweats. Past treatments include beta blockers, angiotensin blockers and diuretics. The current treatment provides significant improvement. Compliance problems include diet and exercise.  Hypertensive end-organ damage includes a thyroid problem.      Review of Systems  Constitutional: Negative.  Negative for fever, chills, malaise/fatigue, diaphoresis, appetite change and fatigue.  HENT: Negative.   Eyes: Negative.  Negative for blurred vision.  Respiratory: Negative for cough, choking, chest tightness, shortness of breath and stridor.   Cardiovascular: Negative.  Negative for chest pain, palpitations, orthopnea, leg swelling and PND.  Gastrointestinal: Negative.  Negative for nausea, vomiting, abdominal pain, diarrhea, constipation and blood in stool.  Endocrine: Negative.   Genitourinary: Negative.   Musculoskeletal: Positive for arthralgias (right hip). Negative for back pain, gait problem, joint swelling, myalgias, neck pain and neck stiffness.  Skin: Negative.   Allergic/Immunologic: Negative.   Neurological: Negative.  Negative for headaches.  Hematological: Negative.  Negative for adenopathy. Does not bruise/bleed easily.  Psychiatric/Behavioral: Negative.  Negative for sleep disturbance, dysphoric mood and decreased concentration. The patient is not nervous/anxious.        Objective:   Physical Exam  Vitals reviewed. Constitutional: She is oriented to person, place, and time. She appears well-developed and well-nourished. No distress.  HENT:  Head: Normocephalic and atraumatic.  Mouth/Throat:  Oropharynx is clear and moist. No oropharyngeal exudate.  Eyes: Conjunctivae are normal. Right eye exhibits no discharge. Left eye exhibits no discharge. No scleral icterus.  Neck: Normal range of motion. Neck supple. No JVD present. No tracheal deviation present. No thyromegaly present.  Cardiovascular: Normal rate, regular rhythm, normal heart sounds and intact distal pulses.  Exam reveals no gallop and no friction rub.   No murmur heard. Pulmonary/Chest: Effort normal and breath sounds normal. No stridor. No respiratory distress. She has no wheezes. She has no rales. She exhibits no tenderness.  Abdominal: Soft. Bowel sounds are normal. She exhibits no distension and no mass. There is no tenderness. There is no rebound and no guarding.  Musculoskeletal: Normal range of motion. She exhibits no edema and no tenderness.  Lymphadenopathy:    She has no cervical adenopathy.  Neurological: She is oriented to person, place, and time.  Skin: Skin is warm and dry. No rash noted. She is not diaphoretic. No erythema. No pallor.  Psychiatric: She has a normal mood and affect. Her behavior is normal. Judgment and thought content normal.     Lab Results  Component Value Date   WBC 8.9 10/01/2013   HGB 12.6 10/01/2013   HCT 38.8 10/01/2013   PLT 314.0 10/01/2013   GLUCOSE 86 10/01/2013   CHOL 175 10/01/2013   TRIG 130.0 10/01/2013   HDL 49.30 10/01/2013   LDLCALC 100* 10/01/2013   ALT 20 10/01/2013   AST 18 10/01/2013   NA 138 10/01/2013   K 3.9 10/01/2013   CL 104 10/01/2013   CREATININE 0.6 10/01/2013   BUN 18 10/01/2013   CO2 23 10/01/2013   TSH 0.03* 10/01/2013   HGBA1C 5.8* 02/08/2012       Assessment & Plan:

## 2013-11-22 NOTE — Patient Instructions (Signed)

## 2014-02-04 ENCOUNTER — Other Ambulatory Visit (INDEPENDENT_AMBULATORY_CARE_PROVIDER_SITE_OTHER): Payer: 59

## 2014-02-04 ENCOUNTER — Other Ambulatory Visit: Payer: Self-pay | Admitting: Internal Medicine

## 2014-02-04 ENCOUNTER — Encounter: Payer: Self-pay | Admitting: Internal Medicine

## 2014-02-04 DIAGNOSIS — I10 Essential (primary) hypertension: Secondary | ICD-10-CM

## 2014-02-04 DIAGNOSIS — E033 Postinfectious hypothyroidism: Secondary | ICD-10-CM

## 2014-02-04 DIAGNOSIS — R739 Hyperglycemia, unspecified: Secondary | ICD-10-CM | POA: Insufficient documentation

## 2014-02-04 DIAGNOSIS — R7309 Other abnormal glucose: Secondary | ICD-10-CM

## 2014-02-04 DIAGNOSIS — E039 Hypothyroidism, unspecified: Secondary | ICD-10-CM

## 2014-02-04 LAB — BASIC METABOLIC PANEL
BUN: 15 mg/dL (ref 6–23)
CALCIUM: 9.3 mg/dL (ref 8.4–10.5)
CO2: 28 mEq/L (ref 19–32)
CREATININE: 0.7 mg/dL (ref 0.4–1.2)
Chloride: 101 mEq/L (ref 96–112)
GFR: 97.31 mL/min (ref >60.00)
Glucose, Bld: 118 mg/dL — ABNORMAL HIGH (ref 70–99)
Potassium: 4.2 mEq/L (ref 3.5–5.1)
SODIUM: 138 meq/L (ref 135–145)

## 2014-02-04 LAB — HEMOGLOBIN A1C: HEMOGLOBIN A1C: 5.9 % (ref 4.6–6.5)

## 2014-02-04 LAB — TSH: TSH: 0.82 u[IU]/mL (ref 0.35–4.50)

## 2014-03-21 ENCOUNTER — Other Ambulatory Visit: Payer: Self-pay | Admitting: Internal Medicine

## 2014-03-21 DIAGNOSIS — I10 Essential (primary) hypertension: Secondary | ICD-10-CM

## 2014-03-21 MED ORDER — METOPROLOL SUCCINATE ER 50 MG PO TB24: 50.0000 mg | ORAL_TABLET | Freq: Every day | ORAL | Status: DC

## 2014-03-25 ENCOUNTER — Telehealth: Payer: Self-pay | Admitting: Internal Medicine

## 2014-03-25 DIAGNOSIS — F418 Other specified anxiety disorders: Secondary | ICD-10-CM

## 2014-03-25 MED ORDER — FLUOXETINE HCL 20 MG PO TABS: 20.0000 mg | ORAL_TABLET | Freq: Every day | ORAL | Status: DC

## 2014-03-25 NOTE — Telephone Encounter (Signed)
She should taper off of the Viibryd, ask her to come pick up samples to do the taper, then switch to prozac, that Rx was sent to her pharmacy

## 2014-03-25 NOTE — Telephone Encounter (Signed)
Patient only has enough samples of viibryd for today and tomorrow.  Patients insurance does not start until September but she also found out that her insurance then will not cover this med. so she would like to come off this med and possible go on on a generic for Prozac.  Please advise.

## 2014-03-28 NOTE — Telephone Encounter (Signed)
lmtcb

## 2014-03-28 NOTE — Telephone Encounter (Signed)
Decrease the viibryd to 20 mg per day now as the start of the taper Start the prozac now

## 2014-03-28 NOTE — Telephone Encounter (Signed)
Patient has a question as to if she needs to start the Prozac after she is done tapering off the medication or during the tapering. Please advise.

## 2014-03-28 NOTE — Telephone Encounter (Signed)
LMOM

## 2014-05-24 ENCOUNTER — Ambulatory Visit (INDEPENDENT_AMBULATORY_CARE_PROVIDER_SITE_OTHER)
Admission: RE | Admit: 2014-05-24 | Discharge: 2014-05-24 | Disposition: A | Discharge status: Home / Self Care | Payer: Medicare HMO | Source: Ambulatory Visit | Attending: Internal Medicine | Admitting: Internal Medicine

## 2014-05-24 ENCOUNTER — Other Ambulatory Visit: Payer: Medicare HMO

## 2014-05-24 ENCOUNTER — Encounter: Payer: Self-pay | Admitting: Internal Medicine

## 2014-05-24 ENCOUNTER — Ambulatory Visit (INDEPENDENT_AMBULATORY_CARE_PROVIDER_SITE_OTHER): Payer: Medicare HMO | Admitting: Internal Medicine

## 2014-05-24 VITALS — BP 110/60 | HR 77 | Temp 98.2°F | Resp 16 | Ht 61.0 in | Wt 295.0 lb

## 2014-05-24 DIAGNOSIS — M25531 Pain in right wrist: Secondary | ICD-10-CM

## 2014-05-24 DIAGNOSIS — E038 Other specified hypothyroidism: Secondary | ICD-10-CM

## 2014-05-24 DIAGNOSIS — I10 Essential (primary) hypertension: Secondary | ICD-10-CM

## 2014-05-24 DIAGNOSIS — M160 Bilateral primary osteoarthritis of hip: Secondary | ICD-10-CM

## 2014-05-24 DIAGNOSIS — M25532 Pain in left wrist: Secondary | ICD-10-CM

## 2014-05-24 DIAGNOSIS — R739 Hyperglycemia, unspecified: Secondary | ICD-10-CM

## 2014-05-24 DIAGNOSIS — Z23 Encounter for immunization: Secondary | ICD-10-CM

## 2014-05-24 DIAGNOSIS — B351 Tinea unguium: Secondary | ICD-10-CM | POA: Insufficient documentation

## 2014-05-24 MED ORDER — NAPROXEN 375 MG PO TABS: 375.0000 mg | ORAL_TABLET | Freq: Two times a day (BID) | ORAL | Status: AC

## 2014-05-24 MED ORDER — TERBINAFINE HCL 250 MG PO TABS: 250.0000 mg | ORAL_TABLET | Freq: Every day | ORAL | Status: AC

## 2014-05-24 NOTE — Progress Notes (Signed)
Subjective:    Patient ID: Kelsey Moore, female    DOB: Oct 30, 1950, 63 y.o.   MRN: [id number]  HPI Comments: She is concerned about abnormal toenails. 1/2 of them on the right and left.  Arthritis Presents for initial visit. The disease course has been fluctuating. The condition has lasted for 3 months. She complains of pain. She reports no stiffness, joint swelling or joint warmth. Affected locations include the right wrist and left wrist. Her pain is at a severity of 2/10. Pertinent negatives include no diarrhea, dry eyes, dry mouth, dysuria, fatigue, fever, pain at night, pain while resting, rash, Raynaud's syndrome, uveitis or weight loss. Her past medical history is significant for osteoarthritis. (Carpal tunnel release 20 yrs ago) Her pertinent risk factors include overuse. Past treatments include nothing. The treatment provided no relief. Factors aggravating her arthritis include activity.      Review of Systems  Constitutional: Negative.  Negative for fever, chills, weight loss, diaphoresis, appetite change and fatigue.  HENT: Negative.   Eyes: Negative.   Respiratory: Negative.  Negative for cough, choking, chest tightness, shortness of breath and stridor.   Cardiovascular: Negative.  Negative for chest pain, palpitations and leg swelling.  Gastrointestinal: Negative.  Negative for nausea, vomiting, abdominal pain, diarrhea, constipation and blood in stool.  Endocrine: Negative.   Genitourinary: Negative.  Negative for dysuria.  Musculoskeletal: Positive for arthralgias and arthritis. Negative for back pain, gait problem, joint swelling, myalgias, neck pain, neck stiffness and stiffness.  Skin: Negative.  Negative for rash.  Allergic/Immunologic: Negative.   Neurological: Negative.   Hematological: Negative.  Negative for adenopathy. Does not bruise/bleed easily.  Psychiatric/Behavioral: Negative.        Objective:   Physical Exam  Vitals reviewed. Constitutional: She is  oriented to person, place, and time. She appears well-developed and well-nourished. No distress.  HENT:  Head: Normocephalic and atraumatic.  Mouth/Throat: Oropharynx is clear and moist. No oropharyngeal exudate.  Eyes: Conjunctivae are normal. Right eye exhibits no discharge. Left eye exhibits no discharge. No scleral icterus.  Neck: Normal range of motion. Neck supple. No JVD present. No tracheal deviation present. No thyromegaly present.  Cardiovascular: Normal rate, regular rhythm, normal heart sounds and intact distal pulses.  Exam reveals no gallop and no friction rub.   No murmur heard. Pulmonary/Chest: Effort normal and breath sounds normal. No stridor. No respiratory distress. She has no wheezes. She has no rales. She exhibits no tenderness.  Abdominal: Soft. Bowel sounds are normal. She exhibits no distension and no mass. There is no tenderness. There is no rebound and no guarding.  Musculoskeletal: Normal range of motion. She exhibits no edema and no tenderness.       Right wrist: She exhibits bony tenderness (over the snuff box and distal radius) and deformity (DJD changes). She exhibits normal range of motion, no swelling, no effusion, no crepitus and no laceration.       Left wrist: She exhibits deformity (DJD). She exhibits normal range of motion, no tenderness, no bony tenderness, no swelling, no effusion, no crepitus and no laceration.  Neg Phalen's and Tinel's over both wrists  Lymphadenopathy:    She has no cervical adenopathy.  Neurological: She is oriented to person, place, and time.  Skin: Skin is warm and dry. No rash noted. She is not diaphoretic. No erythema. No pallor.  50% of toenails show nail thickening, subungual debris, mild nail lysis, there is no erythema, warmth, tenderness.  Psychiatric: She has a normal mood and  affect. Her behavior is normal. Judgment and thought content normal.     Lab Results  Component Value Date   WBC 8.9 10/01/2013   HGB 12.6  10/01/2013   HCT 38.8 10/01/2013   PLT 314.0 10/01/2013   GLUCOSE 118* 02/04/2014   CHOL 175 10/01/2013   TRIG 130.0 10/01/2013   HDL 49.30 10/01/2013   LDLCALC 100* 10/01/2013   ALT 20 10/01/2013   AST 18 10/01/2013   NA 138 02/04/2014   K 4.2 02/04/2014   CL 101 02/04/2014   CREATININE 0.7 02/04/2014   BUN 15 02/04/2014   CO2 28 02/04/2014   TSH 0.82 02/04/2014   HGBA1C 5.9 02/04/2014       Assessment & Plan:

## 2014-05-24 NOTE — Patient Instructions (Signed)
Carpal Tunnel Syndrome The carpal tunnel is a narrow area located on the palm side of your wrist. The tunnel is formed by the wrist bones and ligaments. Nerves, blood vessels, and tendons pass through the carpal tunnel. Repeated wrist motion or certain diseases may cause swelling within the tunnel. This swelling pinches the main nerve in the wrist (median nerve) and causes the painful hand and arm condition called carpal tunnel syndrome. CAUSES   Repeated wrist motions.  Wrist injuries.  Certain diseases like arthritis, diabetes, alcoholism, hyperthyroidism, and kidney failure.  Obesity.  Pregnancy. SYMPTOMS   A "pins and needles" feeling in your fingers or hand, especially in your thumb, index and middle fingers.  Tingling or numbness in your fingers or hand.  An aching feeling in your entire arm, especially when your wrist and elbow are bent for long periods of time.  Wrist pain that goes up your arm to your shoulder.  Pain that goes down into your palm or fingers.  A weak feeling in your hands. DIAGNOSIS  Your health care provider will take your history and perform a physical exam. An electromyography test may be needed. This test measures electrical signals sent out by your nerves into the muscles. The electrical signals are usually slowed by carpal tunnel syndrome. You may also need X-rays. TREATMENT  Carpal tunnel syndrome may clear up by itself. Your health care provider may recommend a wrist splint or medicine such as a nonsteroidal anti-inflammatory medicine. Cortisone injections may help. Sometimes, surgery may be needed to free the pinched nerve.  HOME CARE INSTRUCTIONS   Take all medicine as directed by your health care provider. Only take over-the-counter or prescription medicines for pain, discomfort, or fever as directed by your health care provider.  If you were given a splint to keep your wrist from bending, wear it as directed. It is important to wear the splint at  night. Wear the splint for as long as you have pain or numbness in your hand, arm, or wrist. This may take 1 to 2 months.  Rest your wrist from any activity that may be causing your pain. If your symptoms are work-related, you may need to talk to your employer about changing to a job that does not require using your wrist.  Put ice on your wrist after long periods of wrist activity.  Put ice in a plastic bag.  Place a towel between your skin and the bag.  Leave the ice on for 15-20 minutes, 03-04 times a day.  Keep all follow-up visits as directed by your health care provider. This includes any orthopedic referrals, physical therapy, and rehabilitation. Any delay in getting necessary care could result in a delay or failure of your condition to heal. SEEK IMMEDIATE MEDICAL CARE IF:   You have new, unexplained symptoms.  Your symptoms get worse and are not helped or controlled with medicines. MAKE SURE YOU:   Understand these instructions.  Will watch your condition.  Will get help right away if you are not doing well or get worse. Document Released: 08/02/2000 Document Revised: 12/20/2013 Document Reviewed: 06/21/2011 [employer] Patient Information 2015 [employer]. This information is not intended to replace advice given to you by your health care provider. Make sure you discuss any questions you have with your health care provider.  

## 2014-05-24 NOTE — Progress Notes (Signed)
Pre visit review using our clinic review tool, if applicable. No additional management support is needed unless otherwise documented below in the visit note. 

## 2014-05-25 ENCOUNTER — Telehealth: Payer: Self-pay | Admitting: Internal Medicine

## 2014-05-25 ENCOUNTER — Encounter: Payer: Self-pay | Admitting: Internal Medicine

## 2014-05-25 ENCOUNTER — Telehealth: Payer: Self-pay | Admitting: *Deleted

## 2014-05-25 NOTE — Telephone Encounter (Signed)
Left msg on triage wanting xray results. Called pt back no answer LMOM md has mail out result letter, but did relay md response.Marland KitchenJohny Chess

## 2014-05-25 NOTE — Assessment & Plan Note (Signed)
Start terbinafine

## 2014-05-25 NOTE — Assessment & Plan Note (Signed)
Films are positive for mild DJD - will start an nsaid I am also concerned about CTS so I placed both wrists in splints and asked her to wear them as much as possible If s/s persist then will consider getting a NCS done to check for CTS

## 2014-05-25 NOTE — Assessment & Plan Note (Signed)
Her BP is well controlled Will check her lytes and renal function

## 2014-05-25 NOTE — Telephone Encounter (Signed)
Patient is returning phone call.  °

## 2014-05-25 NOTE — Assessment & Plan Note (Signed)
Will check her TSH and will adjust her dose if needed

## 2014-06-03 ENCOUNTER — Telehealth: Payer: Self-pay | Admitting: Internal Medicine

## 2014-06-03 MED ORDER — DOXYCYCLINE HYCLATE 100 MG PO TABS: 100.0000 mg | ORAL_TABLET | Freq: Two times a day (BID) | ORAL | Status: AC

## 2014-06-03 NOTE — Telephone Encounter (Signed)
Pt notified//lmovm 

## 2014-06-03 NOTE — Telephone Encounter (Signed)
Try doxycycline

## 2014-06-03 NOTE — Telephone Encounter (Signed)
Pt called in said there is a boil under her arm wanted to see if Dr Ronnald Ramp would call in her usual med for that.  She said it doent look good.  She has an appt Monday already setup     Best number [phone] leave vm message.  Pt is at work and cant answers

## 2014-06-18 ENCOUNTER — Other Ambulatory Visit: Payer: Self-pay | Admitting: Internal Medicine

## 2014-06-21 ENCOUNTER — Telehealth: Payer: Self-pay | Admitting: Internal Medicine

## 2014-06-21 ENCOUNTER — Other Ambulatory Visit: Payer: Self-pay | Admitting: Internal Medicine

## 2014-06-21 MED ORDER — LOSARTAN POTASSIUM-HCTZ 100-12.5 MG PO TABS: ORAL_TABLET | ORAL | Status: AC

## 2014-06-21 NOTE — Telephone Encounter (Signed)
Losartan med that was sent to [facility] 10/31, was not received please resend, pt has been out of bp meds. [facility] [phone].

## 2014-06-21 NOTE — Telephone Encounter (Signed)
done

## 2014-06-21 NOTE — Telephone Encounter (Signed)
Faxed tramadol back to [facility]...Johny Chess

## 2014-06-21 NOTE — Telephone Encounter (Signed)
Pt left msg on triage requesting refill on her tramadol as well. She stated pharmacy has been trying to get refills sent last week. Pls advise...Johny Chess

## 2014-06-23 ENCOUNTER — Ambulatory Visit: Payer: Medicare HMO | Admitting: Internal Medicine

## 2014-08-29 ENCOUNTER — Other Ambulatory Visit: Payer: Self-pay

## 2014-08-29 DIAGNOSIS — E039 Hypothyroidism, unspecified: Secondary | ICD-10-CM

## 2014-08-29 MED ORDER — THYROID 81.25 MG PO TABS: 1.0000 | ORAL_TABLET | Freq: Two times a day (BID) | ORAL | Status: DC

## 2015-01-27 ENCOUNTER — Telehealth: Payer: Self-pay

## 2015-01-27 NOTE — Telephone Encounter (Signed)
Patient called to educate on Medicare Wellness apt. LVM for the patient to call back to educate and schedule for wellness visit.   

## 2015-02-02 NOTE — Telephone Encounter (Signed)
Call to home and female answered and hung up ; not sure if number is correct.

## 2015-03-04 ENCOUNTER — Other Ambulatory Visit: Payer: Self-pay | Admitting: Internal Medicine

## 2015-03-07 ENCOUNTER — Encounter: Payer: Self-pay | Admitting: *Deleted

## 2015-03-07 LAB — HM MAMMOGRAPHY

## 2015-03-09 ENCOUNTER — Encounter: Payer: Self-pay | Admitting: *Deleted

## 2015-03-15 ENCOUNTER — Other Ambulatory Visit: Payer: Self-pay | Admitting: Internal Medicine

## 2015-03-21 ENCOUNTER — Encounter: Payer: Self-pay | Admitting: Cardiology

## 2015-03-23 ENCOUNTER — Telehealth: Payer: Self-pay

## 2015-03-23 NOTE — Telephone Encounter (Signed)
Call to intro AWV; May be first Welcome to medicare visit to be scheduled with Dr. Ronnald Ramp.LVM for call back

## 2015-03-23 NOTE — Telephone Encounter (Signed)
Fup from the patient who states that her PCP is no longer in this practice.

## 2015-06-13 ENCOUNTER — Other Ambulatory Visit: Payer: Self-pay | Admitting: Internal Medicine

## 2015-06-19 IMAGING — CR DG HIP COMPLETE 2+V*R*
3 series · 3 of 3 positions shown · non-contrast
Comparison: None.

CLINICAL DATA: One year history of right hip pain, now severe. No
known injuries.

EXAM:
RIGHT HIP - COMPLETE 2+ VIEW

[view not recorded (1 of 3)]
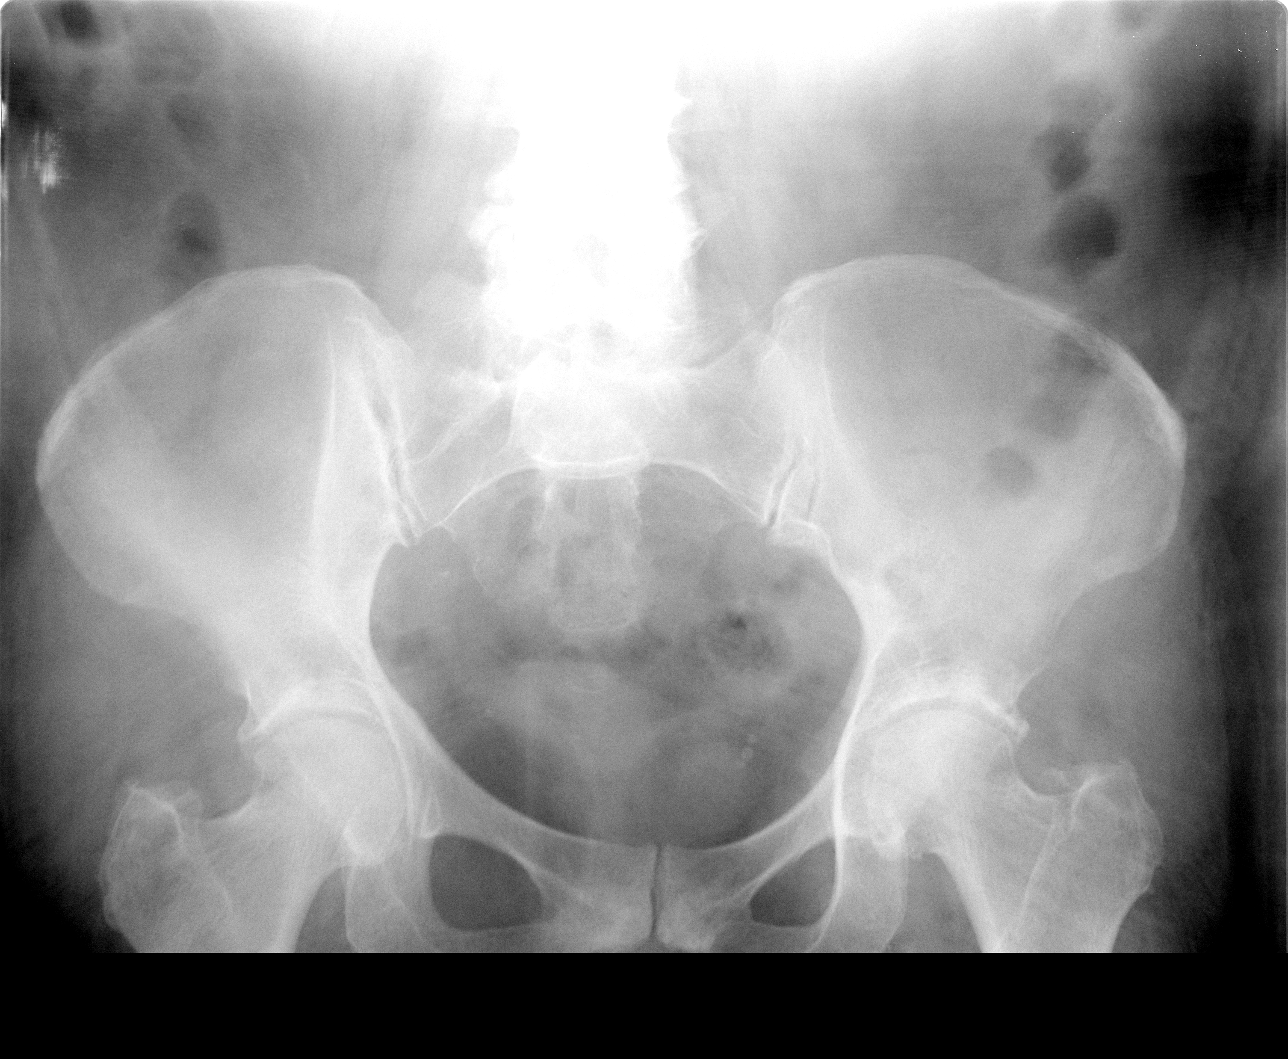

[view not recorded (2 of 3)]
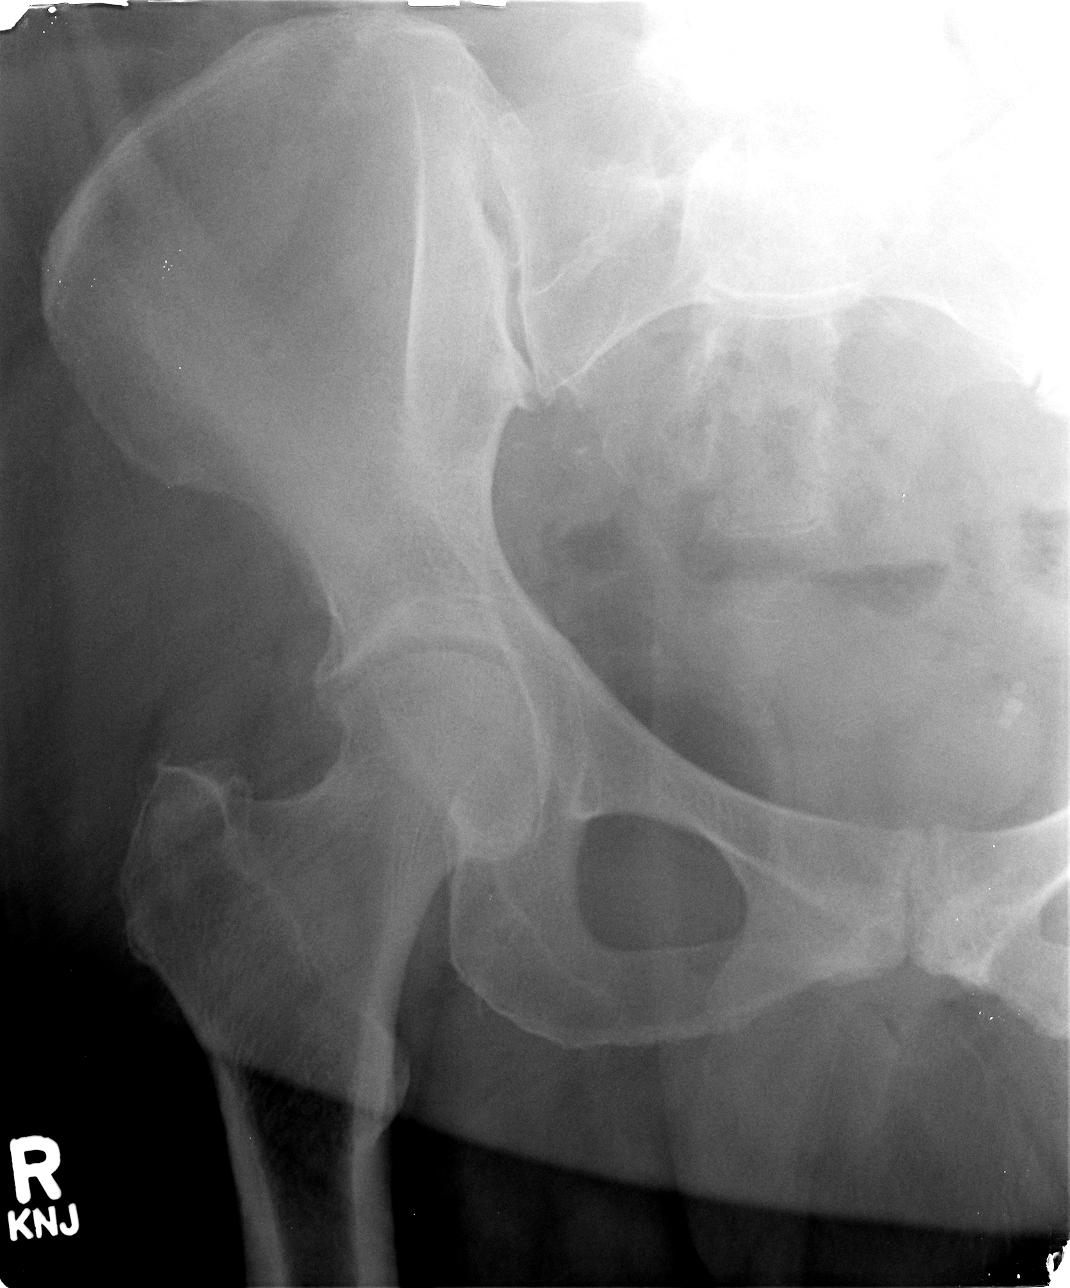

[view not recorded (3 of 3)]
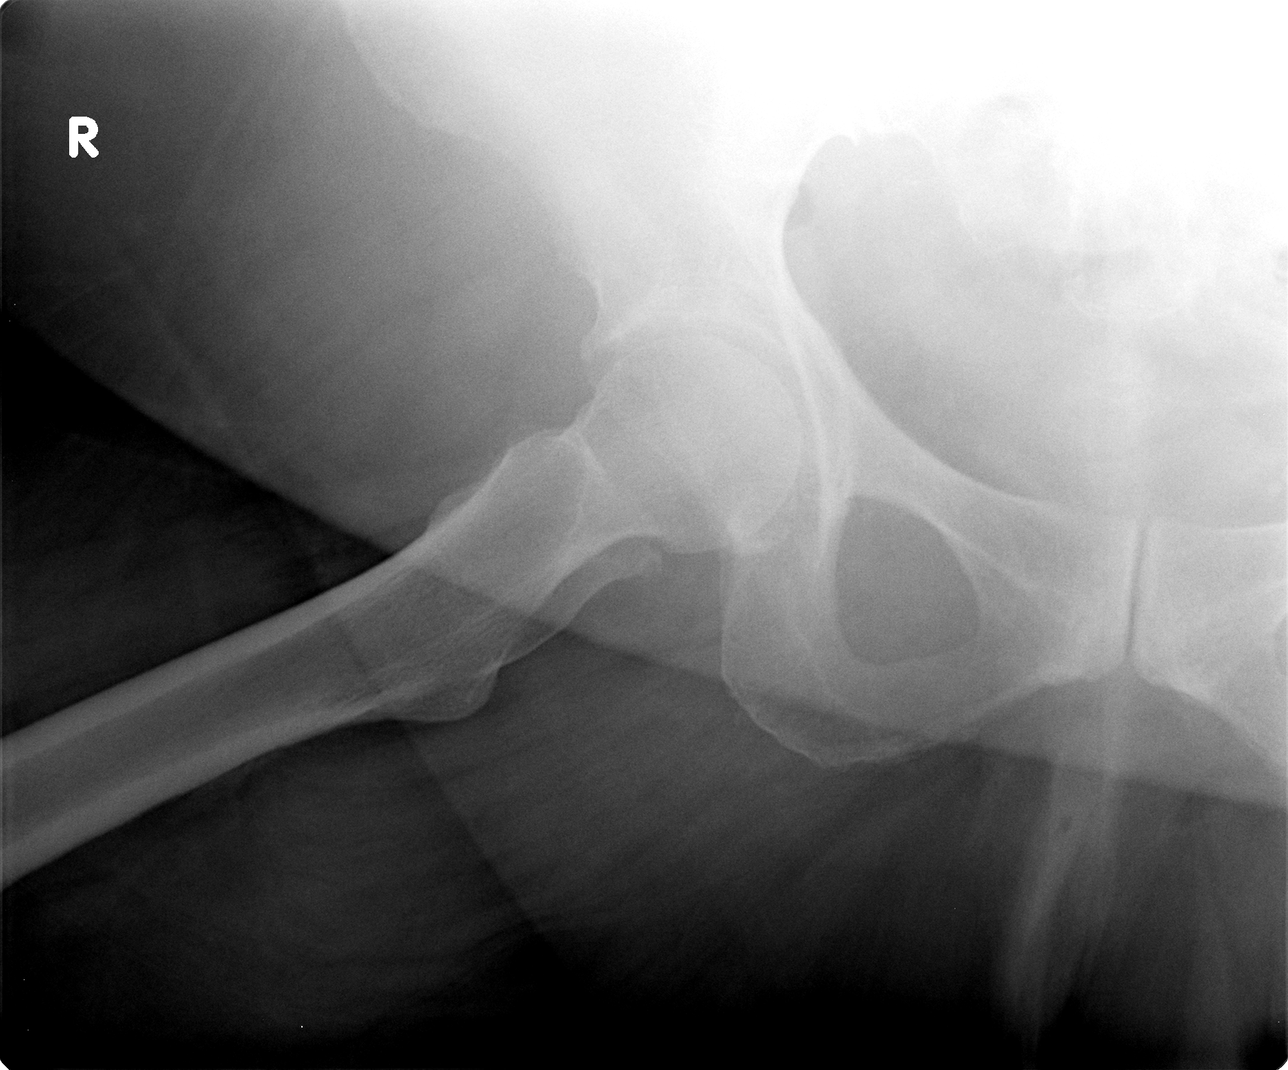

[3 of 3 positions shown; findings below may reference images not displayed]

FINDINGS: Mild to moderate joint space narrowing with mild protrusio
deformity. No evidence of acute, subacute, or healed fractures. Well
preserved bone mineral density for age.

Included AP pelvis shows symmetric mild to moderate joint space
narrowing in the contralateral left hip. Symphysis pubis intact with
degenerative changes. Sacroiliac joints intact. Degenerative changes
involving the visualized lower lumbar spine.
IMPRESSION: 1. Mild to moderate osteoarthritis in the right hip with mild
protrusio deformity.
2. No acute or subacute osseous abnormality.
3. Other findings in the pelvis as reported above.

## 2015-08-31 ENCOUNTER — Other Ambulatory Visit: Payer: Self-pay | Admitting: Internal Medicine

## 2016-02-09 IMAGING — CR DG WRIST COMPLETE 3+V*L*
2 series · 2 of 2 positions shown · non-contrast
Comparison: None.

CLINICAL DATA: Lateral wrist pain and swelling.  No injury.

EXAM:
LEFT WRIST - COMPLETE 3+ VIEW

[view not recorded (1 of 2)]
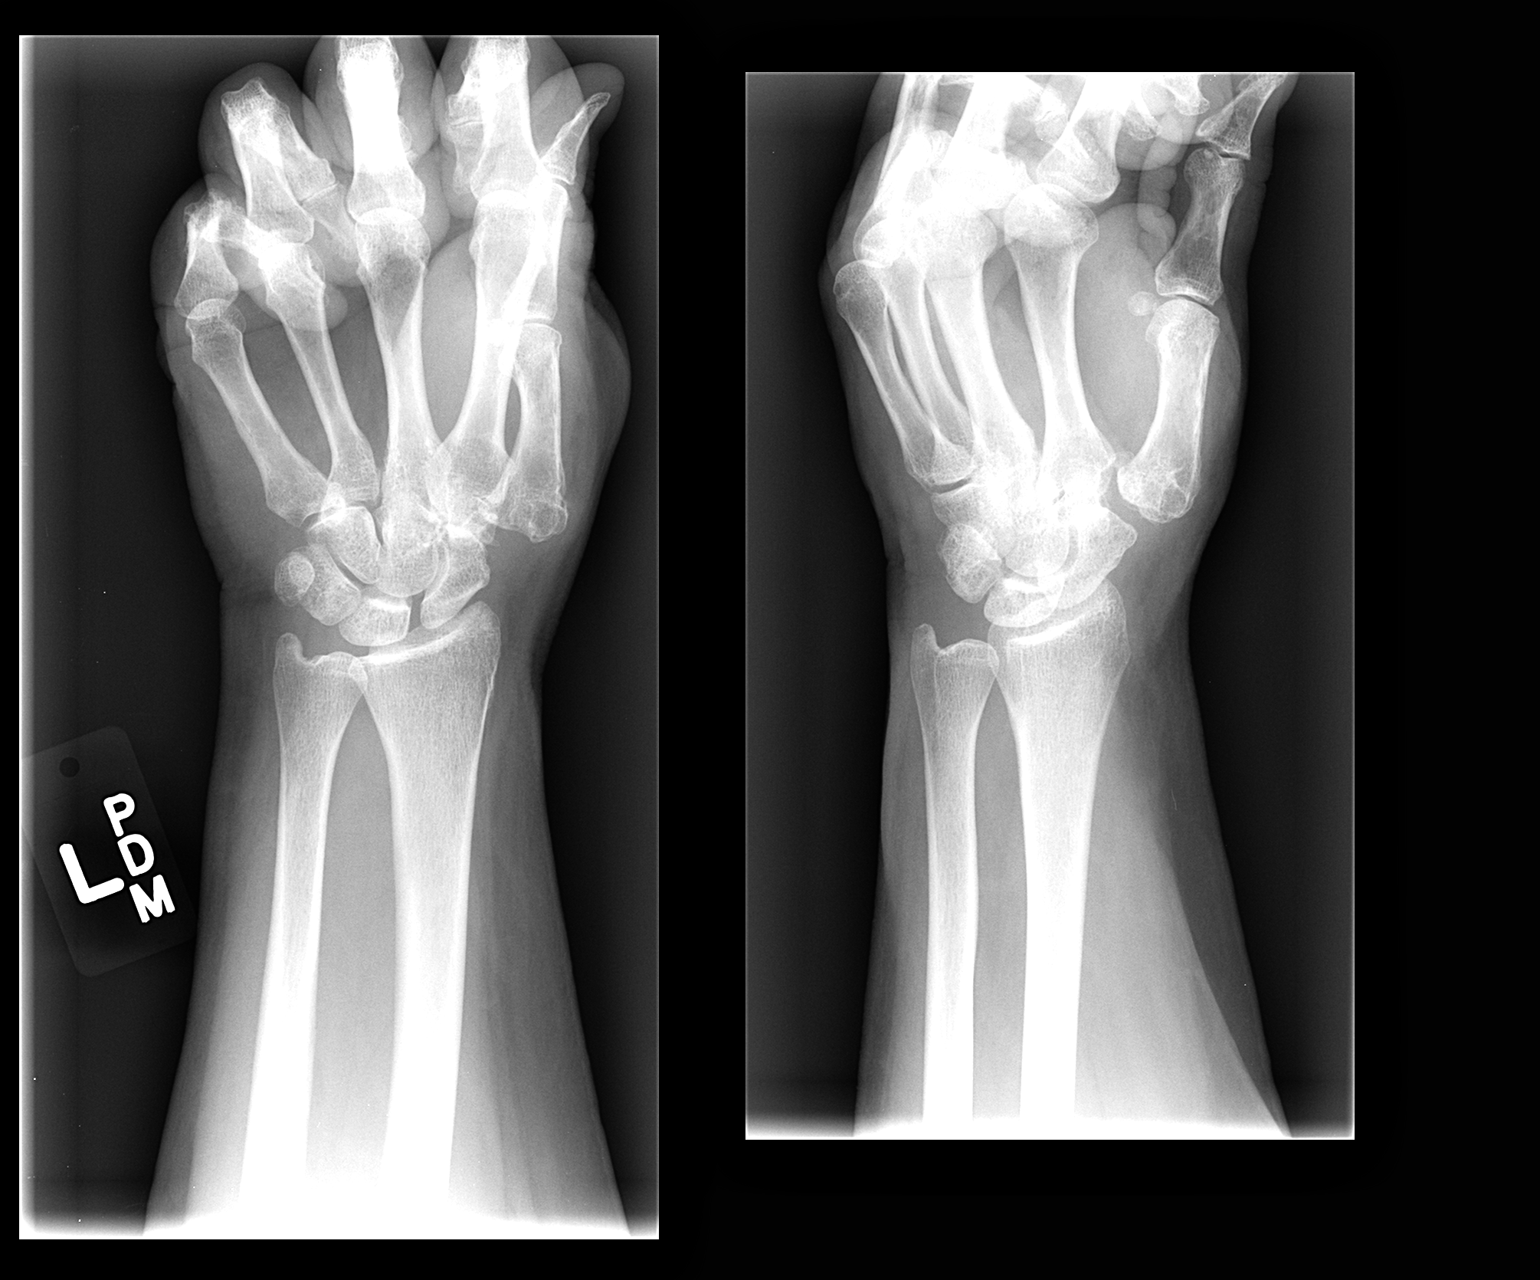

[view not recorded (2 of 2)]
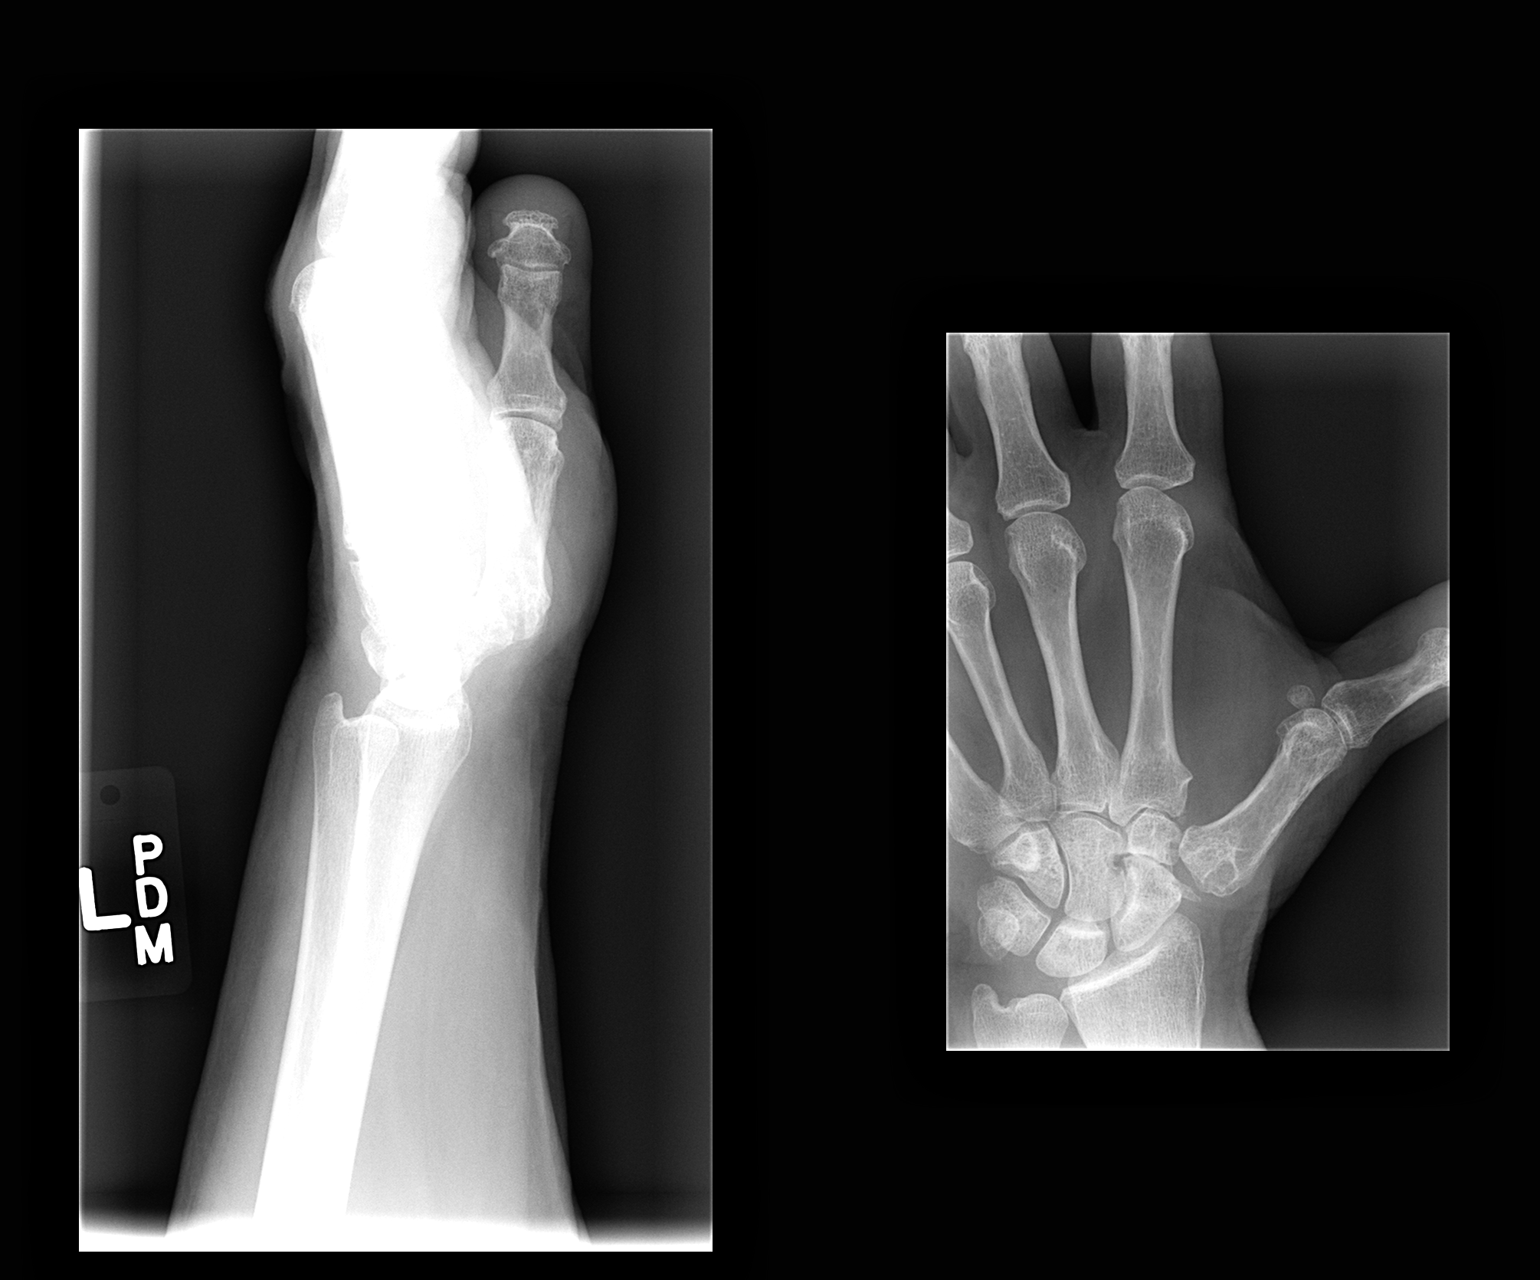

[2 of 2 positions shown; findings below may reference images not displayed]

FINDINGS: Examination demonstrates postsurgical change compatible prior
trapezium resection. There are degenerative changes over the radial
side of the wrist joint and second carpometacarpal joint. There is
no acute fracture or dislocation.
IMPRESSION: No acute findings.

Postsurgical changes along with degenerative changes over the radial
side of the wrist.

## 2016-03-09 ENCOUNTER — Other Ambulatory Visit: Payer: Self-pay | Admitting: Internal Medicine
